# Patient Record
Sex: Female | Born: 1935 | Race: White | Hispanic: No | Smoking: Never smoker
Health system: Southern US, Community
[De-identification: ages and names within clinical notes are randomized; demographics above are authoritative.]

## PROBLEM LIST (undated history)

## (undated) DIAGNOSIS — Z8719 Personal history of other diseases of the digestive system: Secondary | ICD-10-CM

## (undated) DIAGNOSIS — D649 Anemia, unspecified: Secondary | ICD-10-CM

## (undated) DIAGNOSIS — I10 Essential (primary) hypertension: Secondary | ICD-10-CM

## (undated) DIAGNOSIS — Z9889 Other specified postprocedural states: Secondary | ICD-10-CM

## (undated) DIAGNOSIS — M1990 Unspecified osteoarthritis, unspecified site: Secondary | ICD-10-CM

## (undated) HISTORY — DX: Unspecified osteoarthritis, unspecified site: M19.90

## (undated) HISTORY — DX: Essential (primary) hypertension: I10

## (undated) HISTORY — PX: APPENDECTOMY: SHX54

## (undated) HISTORY — DX: Anemia, unspecified: D64.9

## (undated) HISTORY — PX: HERNIA REPAIR: SHX51

## (undated) HISTORY — PX: TOTAL KNEE ARTHROPLASTY: SHX125

## (undated) HISTORY — PX: UMBILICAL HERNIA REPAIR: SHX196

## (undated) HISTORY — DX: Other specified postprocedural states: Z98.890

## (undated) HISTORY — DX: Personal history of other diseases of the digestive system: Z87.19

---

## 2001-11-03 ENCOUNTER — Encounter (HOSPITAL_COMMUNITY): Admission: RE | Admit: 2001-11-03 | Discharge: 2002-02-01 | Payer: Self-pay

## 2001-11-05 ENCOUNTER — Encounter (INDEPENDENT_AMBULATORY_CARE_PROVIDER_SITE_OTHER): Payer: Self-pay | Admitting: Specialist

## 2001-11-05 ENCOUNTER — Ambulatory Visit (HOSPITAL_COMMUNITY): Admission: RE | Admit: 2001-11-05 | Discharge: 2001-11-05 | Payer: Self-pay | Admitting: Gastroenterology

## 2003-01-11 ENCOUNTER — Ambulatory Visit (HOSPITAL_COMMUNITY): Admission: RE | Admit: 2003-01-11 | Discharge: 2003-01-11 | Payer: Self-pay | Admitting: Orthopedic Surgery

## 2004-03-15 ENCOUNTER — Inpatient Hospital Stay (HOSPITAL_COMMUNITY): Admission: RE | Admit: 2004-03-15 | Discharge: 2004-03-21 | Payer: Self-pay | Admitting: Orthopedic Surgery

## 2004-03-27 ENCOUNTER — Inpatient Hospital Stay (HOSPITAL_COMMUNITY): Admission: EM | Admit: 2004-03-27 | Discharge: 2004-04-04 | Payer: Self-pay

## 2005-02-26 ENCOUNTER — Ambulatory Visit (HOSPITAL_COMMUNITY)
Admission: RE | Admit: 2005-02-26 | Discharge: 2005-02-26 | Payer: Self-pay | Admitting: Physical Medicine and Rehabilitation

## 2005-04-24 ENCOUNTER — Encounter: Admission: RE | Admit: 2005-04-24 | Discharge: 2005-07-23 | Payer: Self-pay | Admitting: Family Medicine

## 2006-04-04 ENCOUNTER — Inpatient Hospital Stay (HOSPITAL_COMMUNITY): Admission: EM | Admit: 2006-04-04 | Discharge: 2006-04-09 | Payer: Self-pay | Admitting: Emergency Medicine

## 2006-04-04 ENCOUNTER — Encounter (INDEPENDENT_AMBULATORY_CARE_PROVIDER_SITE_OTHER): Payer: Self-pay | Admitting: *Deleted

## 2006-04-06 ENCOUNTER — Encounter (INDEPENDENT_AMBULATORY_CARE_PROVIDER_SITE_OTHER): Payer: Self-pay | Admitting: Cardiology

## 2006-12-04 ENCOUNTER — Inpatient Hospital Stay (HOSPITAL_COMMUNITY): Admission: EM | Admit: 2006-12-04 | Discharge: 2006-12-08 | Payer: Self-pay | Admitting: Emergency Medicine

## 2007-02-04 ENCOUNTER — Encounter: Admission: RE | Admit: 2007-02-04 | Discharge: 2007-02-04 | Payer: Self-pay | Admitting: Orthopedic Surgery

## 2007-05-19 ENCOUNTER — Encounter (INDEPENDENT_AMBULATORY_CARE_PROVIDER_SITE_OTHER): Payer: Self-pay | Admitting: Orthopedic Surgery

## 2007-05-20 ENCOUNTER — Ambulatory Visit (HOSPITAL_BASED_OUTPATIENT_CLINIC_OR_DEPARTMENT_OTHER): Admission: RE | Admit: 2007-05-20 | Discharge: 2007-05-20 | Payer: Self-pay | Admitting: Orthopedic Surgery

## 2007-06-22 ENCOUNTER — Inpatient Hospital Stay (HOSPITAL_COMMUNITY): Admission: RE | Admit: 2007-06-22 | Discharge: 2007-06-26 | Payer: Self-pay | Admitting: Orthopedic Surgery

## 2007-07-20 ENCOUNTER — Encounter (INDEPENDENT_AMBULATORY_CARE_PROVIDER_SITE_OTHER): Payer: Self-pay | Admitting: Orthopedic Surgery

## 2007-07-20 ENCOUNTER — Ambulatory Visit: Payer: Self-pay | Admitting: Surgery

## 2007-07-20 ENCOUNTER — Ambulatory Visit (HOSPITAL_COMMUNITY): Admission: RE | Admit: 2007-07-20 | Discharge: 2007-07-20 | Payer: Self-pay | Admitting: Orthopedic Surgery

## 2007-08-23 ENCOUNTER — Inpatient Hospital Stay (HOSPITAL_COMMUNITY): Admission: EM | Admit: 2007-08-23 | Discharge: 2007-08-26 | Payer: Self-pay | Admitting: Orthopedic Surgery

## 2007-12-24 ENCOUNTER — Encounter (INDEPENDENT_AMBULATORY_CARE_PROVIDER_SITE_OTHER): Payer: Self-pay | Admitting: Surgery

## 2007-12-24 ENCOUNTER — Ambulatory Visit (HOSPITAL_COMMUNITY): Admission: RE | Admit: 2007-12-24 | Discharge: 2007-12-25 | Payer: Self-pay | Admitting: Surgery

## 2008-07-20 ENCOUNTER — Emergency Department (HOSPITAL_BASED_OUTPATIENT_CLINIC_OR_DEPARTMENT_OTHER): Admission: EM | Admit: 2008-07-20 | Discharge: 2008-07-20 | Payer: Self-pay | Admitting: Emergency Medicine

## 2010-03-20 ENCOUNTER — Ambulatory Visit (HOSPITAL_COMMUNITY): Admission: RE | Admit: 2010-03-20 | Discharge: 2010-03-21 | Payer: Self-pay | Admitting: Surgery

## 2010-07-11 ENCOUNTER — Ambulatory Visit: Payer: Self-pay | Admitting: Surgery

## 2010-07-11 ENCOUNTER — Ambulatory Visit (HOSPITAL_COMMUNITY): Admission: RE | Admit: 2010-07-11 | Discharge: 2010-07-11 | Payer: Self-pay | Admitting: Orthopedic Surgery

## 2010-09-10 ENCOUNTER — Inpatient Hospital Stay (HOSPITAL_COMMUNITY)
Admission: RE | Admit: 2010-09-10 | Discharge: 2010-09-12 | Payer: Self-pay | Source: Home / Self Care | Attending: Orthopedic Surgery | Admitting: Orthopedic Surgery

## 2010-09-10 ENCOUNTER — Encounter (INDEPENDENT_AMBULATORY_CARE_PROVIDER_SITE_OTHER): Payer: Self-pay | Admitting: Orthopedic Surgery

## 2010-12-02 LAB — APTT: aPTT: 34 seconds (ref 24–37)

## 2010-12-02 LAB — BASIC METABOLIC PANEL
BUN: 8 mg/dL (ref 6–23)
BUN: 20 mg/dL (ref 6–23)
CO2: 28 mEq/L (ref 19–32)
Calcium: 8.2 mg/dL — ABNORMAL LOW (ref 8.4–10.5)
Calcium: 9 mg/dL (ref 8.4–10.5)
Chloride: 101 mEq/L (ref 96–112)
Chloride: 100 mEq/L (ref 96–112)
Creatinine, Ser: 0.94 mg/dL (ref 0.4–1.2)
GFR calc Af Amer: 43 mL/min — ABNORMAL LOW (ref >60)
GFR calc Af Amer: >60 mL/min (ref >60)
GFR calc Af Amer: >60 mL/min (ref >60)
GFR calc non Af Amer: 35 mL/min — ABNORMAL LOW (ref >60)
GFR calc non Af Amer: 57 mL/min — ABNORMAL LOW (ref >60)
GFR calc non Af Amer: 57 mL/min — ABNORMAL LOW (ref >60)
Glucose, Bld: 124 mg/dL — ABNORMAL HIGH (ref 70–99)
Glucose, Bld: 157 mg/dL — ABNORMAL HIGH (ref 70–99)
Potassium: 3.9 mEq/L (ref 3.5–5.1)
Potassium: 3.9 mEq/L (ref 3.5–5.1)
Potassium: 3.9 mEq/L (ref 3.5–5.1)
Sodium: 133 mEq/L — ABNORMAL LOW (ref 135–145)
Sodium: 138 mEq/L (ref 135–145)
Sodium: 140 mEq/L (ref 135–145)

## 2010-12-02 LAB — URINALYSIS, ROUTINE W REFLEX MICROSCOPIC
Bilirubin Urine: NEGATIVE
Glucose, UA: NEGATIVE mg/dL
Hgb urine dipstick: NEGATIVE
Ketones, ur: NEGATIVE mg/dL
Nitrite: NEGATIVE
Protein, ur: NEGATIVE mg/dL
Specific Gravity, Urine: 1.009 (ref 1.005–1.030)
Urobilinogen, UA: 0.2 mg/dL (ref 0.0–1.0)
pH: 6.5 (ref 5.0–8.0)

## 2010-12-02 LAB — GRAM STAIN

## 2010-12-02 LAB — CBC
HCT: 27.2 % — ABNORMAL LOW (ref 36.0–46.0)
HCT: 39.7 % (ref 36.0–46.0)
Hemoglobin: 11 g/dL — ABNORMAL LOW (ref 12.0–15.0)
Hemoglobin: 9.1 g/dL — ABNORMAL LOW (ref 12.0–15.0)
MCH: 31.7 pg (ref 26.0–34.0)
MCH: 31.9 pg (ref 26.0–34.0)
MCHC: 33.5 g/dL (ref 30.0–36.0)
MCV: 94.8 fL (ref 78.0–100.0)
MCV: 94.7 fL (ref 78.0–100.0)
Platelets: 169 10*3/uL (ref 150–400)
Platelets: 216 10*3/uL (ref 150–400)
Platelets: 280 10*3/uL (ref 150–400)
RBC: 2.87 MIL/uL — ABNORMAL LOW (ref 3.87–5.11)
RBC: 3.45 MIL/uL — ABNORMAL LOW (ref 3.87–5.11)
RBC: 4.19 MIL/uL (ref 3.87–5.11)
RDW: 12.9 % (ref 11.5–15.5)
RDW: 13.3 % (ref 11.5–15.5)
WBC: 10.1 10*3/uL (ref 4.0–10.5)
WBC: 7.6 10*3/uL (ref 4.0–10.5)
WBC: 5.8 10*3/uL (ref 4.0–10.5)

## 2010-12-02 LAB — SURGICAL PCR SCREEN
MRSA, PCR: NEGATIVE
Staphylococcus aureus: NEGATIVE

## 2010-12-02 LAB — GLUCOSE, CAPILLARY
Glucose-Capillary: 111 mg/dL — ABNORMAL HIGH (ref 70–99)
Glucose-Capillary: 119 mg/dL — ABNORMAL HIGH (ref 70–99)
Glucose-Capillary: 130 mg/dL — ABNORMAL HIGH (ref 70–99)

## 2010-12-02 LAB — ANAEROBIC CULTURE: Gram Stain: NONE SEEN

## 2010-12-02 LAB — DIFFERENTIAL
Basophils Absolute: 0 10*3/uL (ref 0.0–0.1)
Lymphocytes Relative: 21 % (ref 12–46)
Lymphs Abs: 1.2 10*3/uL (ref 0.7–4.0)
Monocytes Absolute: 0.6 10*3/uL (ref 0.1–1.0)
Neutro Abs: 3.8 10*3/uL (ref 1.7–7.7)

## 2010-12-02 LAB — WOUND CULTURE
Culture: NO GROWTH
Gram Stain: NONE SEEN

## 2010-12-02 LAB — PROTIME-INR: Prothrombin Time: 14 seconds (ref 11.6–15.2)

## 2010-12-02 LAB — URINE MICROSCOPIC-ADD ON

## 2010-12-08 LAB — CBC
HCT: 35 % — ABNORMAL LOW (ref 36.0–46.0)
MCHC: 32.5 g/dL (ref 30.0–36.0)
MCV: 96 fL (ref 78.0–100.0)
Platelets: 273 10*3/uL (ref 150–400)
RDW: 14.7 % (ref 11.5–15.5)

## 2010-12-08 LAB — COMPREHENSIVE METABOLIC PANEL
Albumin: 3.7 g/dL (ref 3.5–5.2)
BUN: 27 mg/dL — ABNORMAL HIGH (ref 6–23)
Calcium: 9.9 mg/dL (ref 8.4–10.5)
Creatinine, Ser: 0.97 mg/dL (ref 0.4–1.2)
Total Bilirubin: 0.6 mg/dL (ref 0.3–1.2)
Total Protein: 6.2 g/dL (ref 6.0–8.3)

## 2010-12-08 LAB — GLUCOSE, CAPILLARY
Glucose-Capillary: 72 mg/dL (ref 70–99)
Glucose-Capillary: 85 mg/dL (ref 70–99)

## 2010-12-08 LAB — SURGICAL PCR SCREEN: MRSA, PCR: NEGATIVE

## 2011-02-04 NOTE — H&P (Signed)
[name], [name]                 ACCOUNT NO.:  [id number]   MEDICAL RECORD NO.:  [id number]         PATIENT TYPE:  LINP   LOCATION:                               FACILITY:  [facility]   PHYSICIAN:  Madlyn Frankel. Charlann Boxer, M.D.  DATE OF BIRTH:  1922-11-30   DATE OF ADMISSION:  06/22/2007  DATE OF DISCHARGE:                              HISTORY & PHYSICAL   PROCEDURE:  A right total knee arthroplasty.   CHIEF COMPLAINT:  Right knee pain.   HISTORY OF PRESENT ILLNESS:  This is an 77 year old female with a  history of right knee pain with severe valgus deformity secondary to  lateral bone-on-bone osteoarthritis.  It has been refractory to all  conservative treatment.  She also has a history of a left total knee  replacement in 2006 with which she has had some persistent pain issues.  Her primary care physician is Dr. Gildardo Cranker who will be providing the  presurgical assessment.   PAST MEDICAL HISTORY SIGNIFICANT FOR:  1. Osteoarthritis.  2. Depression.  3. Hypertension.  4. Acid reflux disease.  5. Incontinence.  6. Anxiety.   PAST SURGICAL HISTORY:  Left knee replacement in 2006 and appendectomy.   FAMILY HISTORY:  Noncontributory.   SOCIAL HISTORY:  Married, retired.  Primary caregiver after surgery will  be [employer] and daughter.   DRUG ALLERGIES:  No known drug allergies.   MEDICATIONS:  1. Pantoprazole 40 mg p.o. daily.  2. Lisinopril 40 mg p.o. daily.  3. Cymbalta 90 mg p.o. daily.  4. Proprion SR 150 mg p.o. b.i.d. (please verify with the patient at      admission).  5. Alprazolam 0.5 mg one half tablet p.o. 4x a day.   REVIEW OF SYSTEMS:  NEUROLOGY:  Anxiety, depression.  Has some blurred  vision.  GASTROINTESTINAL:  Hiatal hernia.  HEMATOLOGY/ONCOLOGY:  Had  some anemia during her last surgery.  Otherwise see HPI.   PHYSICAL EXAM:  VITAL SIGNS:  Pulse 76, respirations 18, blood pressure  124/64.  GENERAL:  Awake, alert and oriented, well-developed,  well-nourished, no  acute distress.  NECK:  Supple, no carotid bruits.  CHEST/LUNGS:  Clear to auscultation bilaterally.  HEART:  Regular rate and rhythm without gallops, clicks, rubs, or  murmurs.  ABDOMEN:  Soft, nontender, nondistended.  Bowel sounds present.  GENITOURINARY:  Deferred.  EXTREMITIES:  Right knee has severe valgus deformity with significant  hypertrophic synovium.  She does come out to full extension with flexion  back to 120.  SKIN:  Intact.  No cellulitis.  NEUROLOGIC:  Intact distal sensibilities.   LABS EKG CHEST X-RAY:  All pending.   IMPRESSION:  1. Right knee osteoarthritis.  2. Anxiety/depression.  3. Hypertension.  4. Acid reflux disease.  5. Incontinence.   PLAN OF ACTION:  Right total knee arthroplasty.  Risks, complications  were discussed.  Questions were encouraged, answered, and reviewed.   Postoperative medications including Lovenox Robaxin, iron, aspirin,  Colace, MiraLax were provided at the time of history and physical.  Postoperative pain medication will be provided at the time of surgery.  ______________________________  Yetta Glassman [address], [place]      Madlyn Frankel. Charlann Boxer, M.D.  Electronically Signed    BLM/MEDQ  D:  06/17/2007  T:  06/18/2007  Job:  [id number]   cc:   C. Duane Lope, M.D.  Fax: [phone]

## 2011-02-04 NOTE — Op Note (Signed)
NAMEGRACELYN, COVENTRY                 ACCOUNT NO.:  [id number]   MEDICAL RECORD NO.:  [id number]          PATIENT TYPE:  INP   LOCATION:  1603                         FACILITY:  [facility]   PHYSICIAN:  Madlyn Frankel. Charlann Boxer, M.D.  DATE OF BIRTH:  [birth year]   DATE OF PROCEDURE:  06/22/2007  DATE OF DISCHARGE:                               OPERATIVE REPORT   PREOPERATIVE DIAGNOSIS:  End stage right knee osteoarthritis.   POSTOPERATIVE DIAGNOSIS:  End stage right knee osteoarthritis.   PROCEDURE:  Right total knee replacement.   COMPONENTS USED:  DePuy rotating platform posterior stabilized knee  system, size 2 femur, 2 tibia, and 10 mm insert with a 35 patella  button.   SURGEON:  Madlyn Frankel. Charlann Boxer, M.D.   ASSISTANT:  Yetta Glassman. Mann, P.A.-C.   ANESTHESIA:  Duramorph spinal plus MAC anesthesia.   COMPLICATIONS:  None.   DRAINS:  One.   TOURNIQUET TIME:  48 minutes at 250 mmHg.   INDICATIONS FOR PROCEDURE:  Ms. Bai is a 77 year old female who I have  been following for some time with advanced right knee osteoarthritis and  severe valgus deformity.  She had multiple and recurrent swelling and  synovial bogginess making it hard to aspirate and inject her knees.  She  failed to respond to conservative measures.  Given the persistent and  recurring symptoms, she wished to proceed with the surgical procedure,  though she was a little reluctant to begin with due to the recovery that  she had from her left knee.  The risks and benefits were discussed  including infection, DVT, component failure, need for revision as well  as postoperative pain and discomfort, and consent obtained.   PROCEDURE IN DETAIL:  The patient was brought to the operative theater.  Once adequate anesthesia and preoperative antibiotics, Ancef, were  administered, the patient was positioned supine.  A proximal thigh  tourniquet was placed. The right lower extremity was pre-scrubbed and  prepped and draped in a sterile  fashion.  Given the severe valgus nature  of her knee, I tried to passively correct it to normal before making my  midline incision.  This was done.  A median parapatellar arthrotomy was  carried out.  The patient had a very large effusion with a significantly  large boggy synovium.  Initial exposure consisted of a large synovectomy  superior, anterior medial, and anterior lateral aspects of the knee.  Following this, I attended to the patella.  The patella's precut  measurement was 23 mm.  I resected down to 14 mm and used a 35 patellar  button.  A metal shim was placed to protect the patella from retractor  placement.   At this point, attention was directed to the femur.  The femoral canal  was opened and irrigated to prevent fat emboli.  The intramedullary rod  was then passed into the femoral shaft and at 5 degrees valgus we  resected 10 mm bone.  I was able to resect a little bit of bone off the  lateral condyle despite the severe valgus nature of her knee.  Despite  the valgus nature of the knee, I was able to resect a couple of mm off  the lateral femur.  It did not appear to be significantly hypoplastic.  Following the distal cut, I checked the size of the femur and determined  it was going to be a size 2.  The posterior condylar axis was a little  internally rotated compared to [facility] axis, so I ended up shifting  it down a little bit on the posterolateral aspect of the knee.  This  preserved the joint space in flexion medially.   The anterior, posterior, and chamfer cuts were all made without  complication or notching.  The final box cut was then made without  difficulty.  This was based off the lateral aspect of the distal femur.  At this point, attention was directed to the tibia. Further tibial  exposure including meniscectomies medially and laterally were carried  out.  This did involve, also, synovial debridement both medially and  laterally.  Following exposure of  the tibia, I used an extramedullary  device and resected approximately 6 mm of bone off medial side and 2 mm  laterally.  This was done based on the fact that she was pretty lack to  begin with this valgus knee.  Following this cut, I checked the extensor  block and was happy the knee came out to full extension.  I checked the  cut surface and determined a size 2 would fit best at the cut surface.  I also checked with the alignment rod and felt that my initial cut was a  little bit of varus, so I ended up resecting more of the bone off the  lateral side of the tibia.   I did repeat checks with my alignment rod on the tibial tray and then  was happy and satisfied that the cut was perpendicular in the AP and  lateral planes.  At this point, the final tibial tray position was set,  drilled, and keel punched.  Trial reduction was carried out with 2  femur, 2 tibia and, and a 10 mm insert. The knee came out to full  extension.  There was a little bit of opening medially stretching valgus  knee.  It was not excessive.  The patella tracked without application of  pressure with a 35 button in place.  The trial components were then  removed, the knee was irrigated with normal saline solution and pulse  lavaged and injected with 60 mL of 0.25% Marcaine with epinephrine and 1  mL of Toradol.  The final components were opened and the cement mixed.  The final components were cemented into position and the knee was  brought out to extension with the 10 mm spacer in place.  Extruded  cement was removed.  Once the cement had cured and I was satisfied there  was no remaining cement throughout the knee, the final insert was  placed.  I did let down the tourniquet prior to placing the insert did  inject 5 mL of FloSeal in the posterior, medial, and lateral aspects of  the knee.   At this point, the medium Hemovac drain was placed deep.  Final  polyethylene placed as noted and the knee brought to flexion.  A #1  Vicryl was used to reapproximate the extensor mechanism, 2-0 Vicryl was  used in the subcu layer.  It should be noted that the subcu layer and  epidural layer were very thin so I chose  to use a regular stapler as  opposed to a subcu stitch.  At this point the knee was cleaned, dried  and dressed sterilely with a bulky sterile dressing.  She was brought to  the recovery room in stable condition.      Madlyn Frankel Charlann Boxer, M.D.  Electronically Signed     MDO/MEDQ  D:  06/22/2007  T:  06/22/2007  Job:  [id number]

## 2011-02-04 NOTE — Op Note (Signed)
[name], PATIENT                 ACCOUNT NO.:  [id number]   MEDICAL RECORD NO.:  [id number]          PATIENT TYPE:  OIB   LOCATION:  1505                         [facility]AC[facility]  [facility]   PHYSICIAN:  Thornton Park. Daphine Deutscher, MD  DATE OF BIRTH:  February 27, 1923   DATE OF PROCEDURE:  12/24/2007  DATE OF DISCHARGE:                               OPERATIVE REPORT   PREOPERATIVE DIAGNOSIS:  Umbilical hernia, which is more of a ventral  hernia from a previous laparoscopy.   HISTORY:  This is an 77 year old white female who had a ruptured  appendix in July 2007, repaired laparoscopically; this was repaired with  Prolene at that time and she developed a recurrence of this umbilical  hernia, which had actually gotten kind of big.   The patient was taken to room #1 on December 24, 2007 and given general  anesthesia.  The abdomen was prepped with Techni-Care and draped  sterilely.  I entered through a left upper quadrant 5-mm 0-degree port  site with the Optiview technique without difficulty, insufflated and  identified the defect at the umbilicus.  I removed some incarcerated  omentum.  I then elected to cut down on this from the outside to remove  the very large sac and I did; this left me with about a 2-cm defect.  I  tried a large Ventralex mesh, but it would not deploy safely and exposed  some of the polypropylene mesh to the bowel; I therefore removed it.  I  repaired this transversely with a closure using #1 Novofils,  transversely incorporating the Ultrapro mesh into the transverse  closure; these were tied.  The area was infiltrated.  The wound was then  tacked and closed 4-0 Vicryl and with Dermabond.  Port sites were also  closed with 4-0 Vicryl and Dermabond.  Abdominal binder was applied and  the patient will be kept for overnight observation.   IMPRESSION:  Status post repair of incarcerated umbilical hernia.      Thornton Park Daphine Deutscher, MD  Electronically Signed     MBM/MEDQ  D:  12/24/2007  T:   12/24/2007  Job:  [id number]   cc:   C. Duane Lope, M.D.  Fax: [phone]

## 2011-02-04 NOTE — Op Note (Signed)
[name], [name]                 ACCOUNT NO.:  [id number]   MEDICAL RECORD NO.:  [id number]          PATIENT TYPE:  AMB   LOCATION:  DSC                          FACILITY:  [facility]   PHYSICIAN:  Cindee Salt, M.D.       DATE OF BIRTH:  [birth year]   DATE OF PROCEDURE:  05/20/2007  DATE OF DISCHARGE:                               OPERATIVE REPORT   PREOPERATIVE DIAGNOSES:  1. Carpal tunnel syndrome, left hand.  2. Stenosing tenosynovitis, left middle finger.   POSTOPERATIVE DIAGNOSES:  1. Carpal tunnel syndrome, left hand.  2. Stenosing tenosynovitis, left middle finger.  3. Tenosynovitis.   OPERATION:  Release left carpal canal, release A1 pulley left middle  finger with tenosynovectomy left middle finger.   SURGEON:  Cindee Salt, M.D.   ASSISTANTCarolyne Fiscal, RN.   ANESTHESIA:  Forearm based intravenous regional.   HISTORY:  The patient is an 77 year old female with a history of carpal  tunnel syndrome, triggering of the left middle finger, both  nonresponsive to conservative treatment.  EMG nerve conductions are  positive.  She has elected to proceed to have these released under  regional anesthesia.  She is aware of risks and complications including  infection, recurrence of injury to arteries, nerves, tendon, incomplete  relief of symptoms, dystrophy.  In preoperative area, questions are  encouraged and answered.  The extremity marked by both the patient and  surgeon.   PROCEDURE:  The patient is brought to the operating room where a forearm  based IV regional anesthetic was carried out without difficulty.  She  was prepped using DuraPrep, supine position, left arm free.  A  longitudinal incision was made in the palm, carried down through  subcutaneous tissue.  Bleeders were Bovie electrocauterized.  The palmar  fascia was split.  Superficial palmar arch identified.  The flexor  tendon of the ring and little finger identified.  To the ulnar side of  the  median nerve, the  carpal retinaculum was incised with sharp  dissection.  A right angle and Sewell retractor were placed between the  skin and forearm fascia.  The fascia was released for approximately 1.5  cm proximal to this space under direct vision.  The canal was explored.  The area of compression to the nerve was immediately apparent.  Tenosynovial tissue was moderately thickened, no further lesions were  identified.  The wound was irrigated and the skin closed with  interrupted 5-0 Vicryl Rapide  sutures.  An oblique incision was then  made over the A1 pulley of the left middle finger, carried down to  subcutaneous tissue, bleeders electrocauterized, neurovascular  structures identified and protected, retractor placed.  The significant  thickening in the A1 pulley was noted.  This was released in its radial  aspect.  A small incision was made distally.  A triggering was still  present.  A tenosynovectomy was then performed and a very significant  tenosynovitis was present with a large amount of fluid.  The  tenosynovial tissue was then sent to pathology.  No further triggering  was noted.  The wound was irrigated.  Skin closed with interrupted 5-0  Vicryl Rapide sutures.  Sterile compressive dressing with a  splint with the fingers free applied.  The patient tolerated the  procedure well and was taken to the recovery room for observation in  satisfactory condition.   She will be discharged home  to return to the [facility]  in [place]  in one week on  Vicodin.           ______________________________  Cindee Salt, M.D.     GK/MEDQ  D:  05/20/2007  T:  05/20/2007  Job:  [id number]   cc:   C. Duane Lope, M.D.

## 2011-02-04 NOTE — H&P (Signed)
[name], [name]                 ACCOUNT NO.:  [id number]   MEDICAL RECORD NO.:  [id number]          PATIENT TYPE:  INP   LOCATION:  1606                         FACILITY:  [facility]   PHYSICIAN:  Madlyn Frankel. Charlann Boxer, M.D.  DATE OF BIRTH:  21-Aug-1923   DATE OF ADMISSION:  08/23/2007  DATE OF DISCHARGE:                              HISTORY & PHYSICAL   CHIEF COMPLAINT:  Right knee and leg swelling.   HISTORY OF PRESENT ILLNESS:  This is an 77 year old female with a  history of right total knee replacement approximately nine weeks ago.  She has had persistent swelling with Dopplers that have ruled out DVT.  She had been refractory to all conservative treatment including physical  therapy, early ambulation and use of Lasix.  She was seen in our office  today with persistent swelling.  We aspirated fluid from her knee which  will be sent off for testing to rule out infection.  Due to the  persistent nature of the swelling and with some mild looking cellulitic  changes of her right foot, she is to be admitted for IV antibiotic  treatment and potential washout if positive for infection.   PAST MEDICAL HISTORY:  1. Osteoarthritis with right total knee replacement and left total      knee replacement.  2. Depression.  3. Hypertension.  4. GERD.  5. Incontinence.  6. Anxiety.   PAST SURGICAL HISTORY:  1. Left total knee replacement in 2006.  2. Right total knee replacement in 2008.   FAMILY HISTORY:  Noncontributory.   SOCIAL HISTORY:  Married.   ALLERGIES:  No known drug allergies.   MEDICATIONS:  1. Pantoprazole 40 mg p.o. daily.  2. Lisinopril 40 mg p.o. daily.  3. Cymbalta 90 mg p.o. daily.  4. Wellbutrin 150 mg p.o. b.i.d.  5. Alprazolam 0.5 mg 1/2 tablet p.o. q.i.d. p.r.n. anxiety.   REVIEW OF SYSTEMS:  NEUROLOGICAL:  She has some anxiety and some blurred  vision.  GI: Hiatal hernia.  HEMATOLOGY:  She has a history of anemia  with past surgeries.  Otherwise, see HPI.   PHYSICAL EXAMINATION:  VITAL SIGNS:  Temperature 98, pulse 88,  respirations 18, blood pressure 158/82.  GENERAL:  She is awake, alert, oriented, well-developed, well-nourished  in a wheelchair.  NECK:  Supple.  No carotid bruits.  CHEST:  Lungs clear to auscultation bilaterally.  BREASTS:  Deferred.  HEART:  Regular rate and rhythm without murmurs.  ABDOMEN:  Soft, nontender, nondistended.  Bowel sounds present.  GENITOURINARY:  Deferred.  EXTREMITIES:  Right lower extremity is significantly swollen versus the  left.  I am unable to palpate a dorsalis pedis pulse of the right lower  extremity.  She does have brisk capillary refill.  She does have some  redness of the right lower extremity especially around her foot.  Her  dorsalis pedis pulse is positive in her left lower extremity.  SKIN:  Right lower extremity is swollen with redness cellulitic in  nature of the right foot area.  NEUROLOGIC:  Intact distal sensibilities.   LABS:  Upon admission  include CBC with diff, BMP, coags including INR,  UA, ESR, CRP and chest x-ray.   IMPRESSIONS:  1. Right knee swelling was high suspicion of cellulitis.  Plan of      action will be IV antibiotic treatment.  2. Possible washout with high likelihood of infection.  Risks and      complications were discussed with the patient.     ______________________________  Yetta Glassman. [address], [place]      Madlyn Frankel. Charlann Boxer, M.D.  Electronically Signed    BLM/MEDQ  D:  08/23/2007  T:  08/24/2007  Job:  [id number]

## 2011-02-04 NOTE — Discharge Summary (Signed)
NAMEJANASHA, [name]                 ACCOUNT NO.:  [id number]   MEDICAL RECORD NO.:  [id number]          PATIENT TYPE:  INP   LOCATION:  1603                         FACILITY:  [facility]   PHYSICIAN:  [name], M.D.  DATE OF BIRTH:  [birth year]   DATE OF ADMISSION:  06/22/2007  DATE OF DISCHARGE:  06/26/2007                               DISCHARGE SUMMARY   ADMITTING DIAGNOSES:  1. Osteoarthritis.  2. Depression.  3. Hypertension.  4. Acid reflux disease.  5. Incontinence.  6. Anxiety.   DISCHARGE DIAGNOSES:  1. Osteoarthritis.  2. Depression.  3. Hypertension.  4. Acid reflux disease.  5. Incontinence.  6. Anxiety.  7. Acute blood loss anemia.   CONSULTANTS:  None.   PROCEDURE:  Right total knee replacement.   SURGEON:  [name].   ASSISTANT:  [name].   HISTORY OF PRESENT ILLNESS:  An 77 year old female, history right knee  pain with severe valgus deformity, secondary to lateral bone-on-bone  osteoarthritis.  Refractory to all conservative treatment.  History of  left total knee replacement 2006.  Primary care physician, Dr. Gildardo Cisneros, who provided pre-surgical assessment.   LABS:  Pre-admission CBC:  Hematocrit 35.1, did dip down to 17.7, was  transfused 2 units of packed red blood cells.  Hematocrit came up to  25.4 at time of discharge.  Occult blood was positive on the third.  Coagulation INR normal 1.  Routine chemistry pre-admission all normal.  Did bump [place] a little bit in her glucose 140, all others stable and  normal.  Kidney function normal perfusion.  Calcium 8.4 at discharge.  Routine chemistry GI workup:  Her total protein was 5.9, alk phos was  137, UA negative.   CARDIOLOGY:  EKG:  Sinus tachycardia, incomplete right bundle branch  block, left anterior fascicular block, abnormal ECG.   RADIOLOGY:  No chest x-ray found in chart.   HOSPITAL COURSE:  The patient underwent right total knee replacement,  admitted to the orthopedic floor.   Pain was well-controlled throughout  the course of stay.  Dressing was changed on a daily basis after post-op  day #1.  No significant drainage from the wound at any time.  She was  neurovascular intact of her right lower extremity, with a positive  straight leg raise on post-op day #1 with effort.  Her quads were sore,  which is appropriate for surgery.  She was weightbearing as tolerated,  progressed nicely during her course of stay.  She did have some acute  blood loss anemia with transfusion taking place on the 3rd.  Hematocrit  came back up as noted in labs.  She was afebrile and was ready to go  home.   DISCHARGE DISPOSITION:  Discharged home in stable and improved condition  with home health care PT.   DISCHARGE DIET:  Regular or diabetic.   DISCHARGE WOUND CARE:  Keep dry.   DISCHARGE PHYSICAL THERAPY:  She is weightbearing as tolerated, with the  use of rolling walker and then transition to single point cane.  Goals  of physical therapy will  be range of motion zero to 100 at 2 weeks, zero  to 120, 6 weeks.   DISCHARGE MEDICATIONS:  Include:  1. Lovenox 30 mg subcu q. 12 x11 days.  2. Percocet 5/325, one to two p.o. q.4-6 p.r.n. pain.  3. Robaxin 500 mg p.o. q.6 muscle spasm.  4. Pantoprazole 40 mg p.o. q.a.m.  5. Lisinopril 40 mg p.o. q.a.m.  6. Cymbalta 90 mg at noon.  7. Wellbutrin 150 mg p.o. b.i.d.  8. Alprazolam 0.5 mg 1/2 tablet four times daily.  9. Milk of Magnesia q.h.s.  10.Vitamin B complex daily.  11.Iron 325 mg p.o. t.i.d. x3 weeks.  12.Lasix 20 mg p.o. daily.  13.Potassium 20 mEq p.o. daily.   DISCHARGE FOLLOWUP:  Follow up with Dr. Charlann Cisneros, [phone], in 2 weeks for  wound check.     ______________________________  [name], [place]      [name], M.D.  Electronically Signed    BLM/MEDQ  D:  07/12/2007  T:  07/12/2007  Job:  [id number]   cc:   C. Duane Lope, M.D.  Fax: [phone]

## 2011-02-07 NOTE — Procedures (Signed)
[facility]  Patient:    [name], [name] Visit Number: [id number] MRN: [id number]          Service Type: END Location: ENDO Attending Physician:  Louie Bun Dictated by:   Everardo All Madilyn Fireman, M.D. Proc. Date: 11/05/01 Admit Date:  11/05/2001   CC:         Jamesetta Geralds, M.D.   Procedure Report  PROCEDURE:  Esophagogastroduodenoscopy with biopsy.  INDICATION FOR PROCEDURE:  Severe anemia with hemoglobin 7.1, requiring recent transfusion.  DESCRIPTION OF PROCEDURE:  The patient was placed in the left lateral decubitus position and placed on the pulse monitor with continuous low-flow oxygen delivered by nasal cannula.  She was sedated with 50 mg IV Demerol and 6 mg IV Versed.  The Olympus video endoscope was advanced under direct vision into the oropharynx and esophagus.  The esophagus was slightly tortuous but of normal caliber with the squamocolumnar line at 31 cm.  There was no obvious ring or stricture, visible esophagitis or evidence or Barretts esophagus. There was a very broad hiatal hernia approximately 8 cm in length distal to the squamocolumnar line.  The stomach was entered, and a small amount of liquid secretions were suctioned from the fundus.  Retroflexed view of the cardia confirmed a broad hiatal defect and otherwise was unremarkable with no ulcerations noted in the hernia sac.  The intrathoracic and intra-abdominal fundus and body appeared normal with no ulcers noted.  In the antrum, there was seen several streaks of erythema and granularity radiating toward the pylorus consistent with mild antral gastritis.  A CLOtest was obtained.  The duodenum was entered and both the bulb and second portion were well-inspected and appeared to be within normal limits.  The scope was advanced as far as possible down into the duodenum, and biopsies were taken to rule out celiac disease.  The scope was then withdrawn, and the patient returned to  the recovery room in stable condition.  She tolerated the procedure well, and there were no immediate complications.  IMPRESSION: 1. Large hiatal hernia. 2. Mild antral gastritis.  PLAN:  Await CLOtest and biopsy results.  Suspect anemia may be related to the large hiatal hernia.  Will also proceed with colonoscopy at this time. Dictated by:   Everardo All Madilyn Fireman, M.D. Attending Physician:  Louie Bun DD:  11/05/01 TD:  11/06/01 Job: [id number]

## 2011-02-07 NOTE — H&P (Signed)
NAME:  [name], [name] NO.:  [id number]   MEDICAL RECORD NO.:  [id number]                   PATIENT TYPE:  EMS   LOCATION:  MAJO                                 FACILITY:  [facility]   PHYSICIAN:  Hollice Espy, M.D.            DATE OF BIRTH:  08/24/1923   DATE OF ADMISSION:  03/27/2004  DATE OF DISCHARGE:                                HISTORY & PHYSICAL   PRIMARY CARE PHYSICIAN:  Miguel Aschoff, M.D.   ORTHOPAEDIC SURGEON:  Harvie Junior, M.D.   [facility], Dr. Danise Edge.   CHIEF COMPLAINT:  GI bleed.   HISTORY OF PRESENT ILLNESS:  This is a 77 year old white female with past  medical history of diabetes, hypertension, and osteoporosis, who was  discharged from [facility] status post a left total knee  replacement on March 21, 2004.  The patient at that time was started on  Coumadin for the first time for DVT prophylaxis.  She had done relatively  well, her only complaint being of leg pain when last night she started  having sudden onset vomiting of blood.  These episodes occurred x2 yesterday  and persisted today.  The patient also was noted to have some black stool  increased today.  She tells me that she has been on iron in the past and has  had previously dark stools, although I do not see any indication of iron on  her discharge summary from one week ago.  The patient had stat labs done at  the [facility] where she was getting rehab, and her hemoglobin was noted  to be previously 12.1 and 37.1 on March 23, 2004, however, today, it was down  to 6.9.  The patient was brought in to the emergency room for a stat  evaluation of a GI bleed.  Here, she had lab work done and she was noted to  have a white count of 15.5 with a normal shift.  Her H&H, however, were 6.8  and 20.  Her platelet count was elevated at 723.  In addition, the patient's  INR was elevated at 3, consistent with her being on Coumadin.  Her  BUN was  elevated at 79, with a creatinine slightly elevated at 1.2.  The patient was  started on IV fluids, was typed and crossed, and is currently receiving the  first of two units of packed red blood cells.  Her blood pressure has been  borderline hypotensive with a systolic of 103.  She is not tachycardic.  Currently, she states that she is feeling a little bit better.  She denies  any headache, visual changes, or dysphagia.  She denies any current nausea  or vomiting.  She denies any chest pain or palpitations.  She denies any  shortness of breath, wheeze, cough.  She denies any abdominal pain until I  started to palpate on her abdomen.  The patient's only real complaint is of  occasional leg pains.  She has trouble getting comfortable when she lays on  one leg she begins to hurt and so she changes position.  The patient also  denies any hematuria or dysuria.  She denies any constipation and does note  she has had some problems with dark stools.  She is not sure if these are  bloody.   PAST MEDICAL HISTORY:  1. Osteoporosis.  2. Hypertension.  3. Diabetes.  4. Hyperlipidemia.  5. Recent left total knee replacement.  6. DJD.  7. History of chronic anemia, status post EGD done in February 2003, by Dr.     Madilyn Fireman.  At that time, she was noted to have antral gastritis.  8. History of anxiety and depression.  9. History of right carpal tunnel syndrome.   MEDICATIONS:  According to her discharge summary list from one week ago:  1. Vicodin 10 mg q.6h. p.r.n. pain.  2. Coumadin q.h.s.  3. Aciphex 20 mg daily.  4. Lisinopril 20 mg daily.  5. Hydrochlorothiazide 25 mg daily.  6. Caltrate 600 mg b.i.d.  7. Pravachol 40 mg q.h.s.  8. Effexor XR 150 mg daily.  9. Xanax 1 mg t.i.d. p.r.n.  10.      Prozac 40 mg daily.  11.      Milk of magnesia q.h.s.  12.      Fosamax 70 mg every week.  13.      In addition, the patient also takes Sanctura for bladder control.   ALLERGIES:  No known  drug allergies.   SOCIAL HISTORY:  She denies any alcohol, tobacco, or drug use.   FAMILY HISTORY:  Noncontributory.   PHYSICAL EXAMINATION:  VITAL SIGNS:  Temperature 97.2, heart rate was 66 at  time of transfer, has gone up slightly to 98, respirations 18, blood  pressure 108/56, which has stayed steady.  GENERAL:  She appears to be in mild general distress secondary more to leg  pain than anything else, but she is alert and oriented x3, and looks quite  fatigued.  HEENT:  Normocephalic, atraumatic.  She has dry mucous membranes.  NECK:  No carotid bruits.  HEART:  Regular rate and rhythm.  S1 and S2.  LUNGS:  Clear to auscultation bilaterally.  ABDOMEN:  Soft, slightly tender, but no focal tenderness and more diffuse.  It is non-distended.  She has very hypoactive bowel sounds.  Her Hemoccult  is heme positive with report of black stool by the ER attending.  EXTREMITIES:  Her left knee shows status post incision with staples.  The  incision itself appears to be clean and dry without erythema.  She has some  surrounding erythema of the left patellar region.  It is slightly tender.  She has 1+ pulses at best.  She has 1+ pitting edema in her bilateral lower  extremities.   LABORATORY DATA:  The patient's sodium is 132, potassium 4.7, chloride 100,  bicarbonate 24, BUN 79, creatinine 1.2, glucose 103.  Her LFT's:  Alkaline  phosphatase is 149, AST 18, ALT 13, total protein 5.3, albumin 2.6, calcium  9.5.  Her lipase is normal at 18.  pH of 7.49, PCO2 of 35, bicarbonate of  27.  Her PTT is 54, PT/INR 24.5 and 3.  Her white count is 15.5, H&H of 6.8  and 20.  MCV is 86.  Platelet count is 723.  She has a 75% shift which is  considered a high end  of normal.   ASSESSMENT AND PLAN:  1. Upper gastrointestinal bleed.  The patient with a history of severe     anemia and she is also on Coumadin.  In addition, I note that her PTT is    elevated.  I am wondering that if she is on Vicodin for  pain that may     have interfered slightly with her liver dysfunction, although her     transaminases are normal, but either way this is a likely cause of a     bleeding ulcer following her knee replacement.  We will make her n.p.o.,     intravenous fluids, she is getting a pack of red blood cells, intravenous     Protonix, and hold her Coumadin, check her PT/INR in the morning.  GI,     Dr. Laural Benes, from [facility] will see her and possibly scope her tomorrow.  2. Hypertension.  Her blood pressure is borderline hypotensive.  It should     improve with blood.  We can restart her medications then.  3. Diabetes.  We will cover with sliding scale.  She is normally diet     controlled.  4. Osteoporosis.  Holding her calcium and Fosamax.  5. Recent left total knee replacement.  We will notify Dr. Jodi Geralds of     orthopaedics, the patient is currently here.  She is scheduled to follow     up with him in one week which would be tomorrow.  He may want to see her     in the hospital.  Also note that the patient's left knee is erythematous,     but she has no fever and no shift.  Her increase in white count may be     stress only, so we will monitor to make sure she has no sign of a     cellulitis.  6. Depression.  The patient is on both Effexor and Prozac which would     question if both are indicated, but would make no changes at this time.     We will hold both for now until she is p.o.  7. Hyperlipidemia.  Holding her Pravachol.                                                Hollice Espy, M.D.    SKK/MEDQ  D:  03/27/2004  T:  03/27/2004  Job:  [id number]   cc:   C. Duane Lope, M.D.  [address]  [place]  [place] [address]  Fax: [phone]   Hal T. Stoneking, M.D.  [address]  [place], [place] [address]  Fax: [phone]   Harvie Junior, M.D.  [address]  [place]  [place] [address]  Fax: [phone]   Danise Edge, M.D.  [address]  [place]  [place] [address]  Fax: [phone]

## 2011-02-07 NOTE — Discharge Summary (Signed)
[name], [name]                           ACCOUNT NO.:  [id number]   MEDICAL RECORD NO.:  [id number]                   PATIENT TYPE:  INP   LOCATION:  5008                                 FACILITY:  [facility]   PHYSICIAN:  Harvie Junior, M.D.                DATE OF BIRTH:  [birth year]   DATE OF ADMISSION:  03/15/2004  DATE OF DISCHARGE:  03/21/2004                                 DISCHARGE SUMMARY   ADMITTING DIAGNOSES:  1. Degenerative joint disease, left knee.  2. Hypertension.  3. Diet controlled diabetes mellitus.  4. Chronic anemia.  5. History of anxiety and depression.   DISCHARGE DIAGNOSES:  1. Degenerative joint disease, left knee.  2. Hypertension.  3. Diet controlled diabetes mellitus.  4. Chronic anemia.  5. History of anxiety and depression.  6. Chronic anemia postoperatively.  7. Right carpal tunnel syndrome.   PROCEDURES IN THE HOSPITAL:  Left total knee arthroplasty, Harvie Junior,  M.D., March 15, 2004.   CONSULTATIONS IN HOSPITAL:  [facility]   BRIEF HISTORY:  [name] is a 77 year old female who we have followed for  a long time with severe DJD of both knees.  She has significant pain at  night in her knees as well as with ambulation.  She has x-ray findings of  bone-on-bone changes in both knees, left was greater than right.  She was  temporarily improved with cortisone injections, modification of her  activities, and use of pain medications, but she continued to have problems  with her knees and based upon her radiographic and clinical findings was  felt to be a candidate for a left total knee arthroplasty.  She is admitted  for this.   PERTINENT LABORATORY STUDIES:  The patient's hemoglobin, on admission, was  10.4, hematocrit 31.5.  On post-op day #1, her hemoglobin was 7.9, two units  of packed RBCs were transfused bringing her hemoglobin up to 9.5.  On March 18, 2004, her hemoglobin dropped again to 8.0, two units of packed RBCs were  transfused bringing her up to 10.9 on March 19, 2004, and on March 20, 2004,  her hemoglobin was 11.8 with a hematocrit of 35.4.  Her reticulocyte count  was 4.2, RBCs 3.04, and sed rate was 75.  Pro time, on admission, was 13.5  seconds with an INR of 1.0, PTT of 27.  On the date of discharge on Coumadin  therapy, her pro time was 17.4 seconds with an INR of 1.7.  C-MET, on  admission, was within normal limits without abnormalities.  Her B-12 level  was 340.  Her folate serum level was greater than 20, and her ferritin level  was 84.  Urinalysis, on admission, showed no abnormalities.   Preoperative x-ray of the left knee showed degenerative changes with no  evidence of acute fracture.  This was taken because she had a fall the  morning of surgery striking her left knee.  Preoperative chest x-ray showed  no acute chest disease with a moderate sized hiatal hernia.   EKG, on admission, showed a normal sinus rhythm with a left anterior  fascicular block.   HOSPITAL COURSE:  The patient underwent left total knee arthroplasty that is  well described in Dr. Luiz Blare operative note on March 15, 2004.  Postoperatively, she was put on a PCA morphine pump for pain control and CPM  machine to the left knee, IV fluids and Ancef 1 gram IV q.8h. x 5 doses  prophylactically.  She was seen by physical therapy for walker ambulation,  weightbearing as tolerated on the left side.  On post-op day #1, she had a  low hemoglobin of 7.9, and her INR was 1.1 on Coumadin therapy.  She was  given two units of packed RBCs which brought her hemoglobin up to 9.5 on  post-op day #2.  She also had some difficulty at this point in time with  some numbness and tingling in the fingers of her right hand with a positive  Tinel sign and positive __________  causing tingling in the fingers.  She  was placed in a wrist splint for this.  On post-op day #2, her IV was  converted to a saline lock.  Her dressing was changed and the  Hemovac drain  was pulled.  A rehab consult was obtained.  On post-op day #2, she dropped  her hemoglobin down to 8.0.  Her INR was 2.3.  [facility] hospitalists were  consulted to evaluate her anemia.  Her pain medications were changed to  Vicodin.  We had an excellent workup per the [facility] hospitalists, and they  suggested two additional units of packed RBCs and did a thorough workup for  her anemia.  On post-op day #4, she was making good progress but lives alone  and needed some assistance at home.  She was felt to be a candidate for  either inpatient rehabilitation subacute unit or possibly a skilled nursing  facility.  On March 19, 2004, the patient was making good progress.  Her  hemoglobin was stable.  On March 20, 2004, the patient was taking fluids and  voiding without difficulty.  Her vital signs were stable.  She was afebrile.  Her hemoglobin was 11.8.  Her INR was 1.7.  She is to be transferred to the  [facility] on March 21, 2004.  She will continue physical therapy there for  walker ambulation, weightbearing as tolerated.  She will also use a home CPM  machine, eight hours per day, starting at 0 degrees, 65 degrees, increasing  5 degrees daily up to 90 degrees.  Her saline lock was discontinued.   MEDICATIONS ON DISCHARGE:  1. Vicodin 5 mg two q.6h. p.r.n. pain.  2. Coumadin per pharmacy protocol x 1 month post-op, shooting for an INR of     2.0.  3. Aciphex 20 mg q.a.m.  4. __________  20 mg b.i.d.  5. Lisinopril 20 mg q.a.m.  6. HCTZ 25 mg q.a.m.  7. Caltrate 600 mg b.i.d.  8. Pravachol 40 mg q.h.s.  9. Effexor XR 150 mg q.a.m.  10.      Xanax 1 mg t.i.d. p.r.n. anxiety.  11.      Prozac 40 mg every other day.  12.      Milk of Magnesia q.h.s.  13.      Fosamax 70 mg once per week.   She will  need to see Dr. Luiz Blare back in his office in ten days.  The patient  will need to call [phone] for an appointment.     Marshia Ly, P.A.                       Harvie Junior,  M.D.    Cordelia Pen  D:  03/20/2004  T:  03/20/2004  Job:  [id number]   cc:   Tenny Craw, Dr.  [facility]   Harvie Junior, M.D.  [address]  [place]  [place] [address]  Fax: [phone]

## 2011-02-07 NOTE — Discharge Summary (Signed)
[name], [name]                 ACCOUNT NO.:  [id number]   MEDICAL RECORD NO.:  [id number]          PATIENT TYPE:  INP   LOCATION:  1516                         FACILITY:  [facility]   PHYSICIAN:  Almedia Balls. Ranell Patrick, M.D. DATE OF BIRTH:  [birth year]   DATE OF ADMISSION:  12/04/2006  DATE OF DISCHARGE:  12/08/2006                               DISCHARGE SUMMARY   ADMISSION DIAGNOSIS:  Left hip pain.   HISTORY:  The patient is an 77 year old female who is complaining about  severe left hip pain, but she denied any known injury.  The patient  presented to the office and was admitted for pain management.   Plain x-rays were negative.  A CT scan showed adductor strain of that  left hip, causing her left hip pain.   The patient was basically admitted for pain management.   HOSPITAL COURSE:  The patient was admitted on December 04, 2006 with  intractable left hip pain.  The patient did have pain with internal and  external rotation of that left hip, but there were no signs of fracture  on her x-ray.  Thus a physical therapy and pain management program were  started.  She continued to work well with therapy until hospital day  three, she was doing much better with decreased pain, and was ready to  be discharged home, with home health physical therapy.   The patient was relieved that she did not have to have any type of  surgical procedure to be performed for this left hip.  She was doing  quite well.  She was afebrile throughout her stay.  Initial laboratory  work showed mildly elevated white blood count at 14.8, some mild anemia,  but otherwise negative.   ADMISSION DIAGNOSIS:  Intractable left hip pain.   DISCHARGE DIAGNOSIS:  Left hip pain, secondary to adductor strain.   DISCHARGE PLAN:  The patient will be discharged home with home health  physical therapy on December 08, 2006.   ALLERGIES:  PERCOCET.   DISCHARGE MEDICATIONS:  1. Xanax 0.25 mg p.o. q.i.d.  2. Wellbutrin 150 mg p.o.  b.i.d.  3. Enablex 7.5 mg p.o. q.d.  4. Cymbalta 90 mg p.o. q.h.s.  5. Lasix 40 mg p.o. b.i.d.  6. Insulin:  One to 11 units subcu t.i.d.  7. Lisinopril 40 mg p.o. q.d.  8. Robaxin 500 mg p.o. q.6h.  9. Protonix 40 mg p.o. q.d.  10.Vicodin one to two tab q.4-6h. p.r.n. pain.  11.Ambien 5 mg p.o. q.h.s. p.r.n.   FOLLOWUP:  The patient will follow up with Dr. Almedia Balls. Norris in two  weeks.      Thomas B. Durwin Nora, P.A.      Almedia Balls. Ranell Patrick, M.D.  Electronically Signed    TBD/MEDQ  D:  12/08/2006  T:  12/08/2006  Job:  [id number]

## 2011-02-07 NOTE — Discharge Summary (Signed)
NAMEDYLYNN, KETNER                 ACCOUNT NO.:  [id number]   MEDICAL RECORD NO.:  [id number]          PATIENT TYPE:  INP   LOCATION:  5735                         FACILITY:  [facility]   PHYSICIAN:  Currie Paris, M.D.DATE OF BIRTH:  Jun 28, 1923   DATE OF ADMISSION:  DATE OF DISCHARGE:  04/09/2006                                 DISCHARGE SUMMARY   PRIMARY CARE PHYSICIAN:  Dr. Gildardo Cranker with [facility].   DISCHARGING PHYSICIAN:  Dr. Jamey Ripa   CHIEF COMPLAINT AND REASON FOR ADMISSION:  Ms. Chernick is an 77 year old  female patient with history of hiatal hernia and gastroesophageal reflux  disease who has had 24 hours worth of abdominal pain.  Temperature in the ER  was 101.  Vital signs were otherwise stable.  White count was 15,900,  hemoglobin 9.2.  CT demonstrated appendicolith with appendicitis.  The  patient had localized pain in the right lower quadrant on exam.  The patient  was admitted by Dr. Daphine Deutscher with a diagnosis of acute appendicitis.   HOSPITAL COURSE:  The patient was taken from the emergency department to the  OR by Dr. Daphine Deutscher, where she underwent a laparoscopic appendectomy and repair  of umbilical hernia.  She was found to have ruptured appendicitis.  Because  of her age and the fact that she had ruptured appendicitis, she was sent to  the PACU initially to recover and then to a step-down bed initially.   On postoperative day 1, the patient was having some difficulty with systolic  hypotension with pressures between 70-85.  Her white count was down to  10,400.  Her hemoglobin was 7.4.  It was felt that her hypotension was due  to a combination of volume depletion and anemia.  A PICC line was placed and  she was transfused with two units of packed red blood cells.  By later in  the day her blood pressure had increased to 109/51.  She had received one  complete unit of packed red blood cells and was in the process of receiving  the second unit.  She reported  feeling much better after the transfusion.   On postoperative day 2, the patient's blood pressure was still somewhat low.  Systolic was 90.  White count was up slightly to 11,500 and hemoglobin was  stable at 9.5.  Electrolytes were stable.  JP drain had a large amount of  drainage, serosanguineous, 140 mL.  In regards to her abdomen, she had mild  abdominal pain at the incisions.  No flatus.  No BM.  She was somewhat dizzy  but had no syncopal episodes.  JP remained with serosanguineous output.  Because of the continued hypotension she was left in step-down unit for  further monitoring.   Over the next several days, the patient continued to slowly improve.  Her  hemoglobin drifted down slightly to 8.8, white count was down to 8,900.  She  developed flatus and clear liquids were offered.  By postoperative day 4,  she was up to the chair.  She was performing incentive spirometry up to 1500  mL.  She had flatus but no BM.  Her abdomen was soft and she was slightly  tender over the staple lines over the stab wounds.  Her bowel sounds were  present and she was tolerating PCA pain medications.  Her blood pressure had  normalized out and her hemoglobin was 9.6, white count 8,100.  Her diet was  advanced and she was switched from PCA pain medications to Vicodin.  She had  been continued on Unasyn postoperatively and this was day 4.  Her IV fluids  were KVO'd and by postoperative day 5, Dr. Jamey Ripa evaluated the patient and  her vital signs were stable, she was tolerating a diet advanced, and she was  deemed appropriate for discharge home.   FINAL DISCHARGE DIAGNOSES:  1. Abdominal pain secondary to acute perforated appendicitis.  2. Status post laparoscopic appendectomy.  3. Hypotension secondary to anemia, status post transfusion of two units      of packed red blood cells, hemoglobin stable.  4. Systolic murmur, echocardiogram done this admission shows mild aortic      stenosis.  5.  Leukocytosis, resolved.   DISCHARGE MEDICATIONS:  1. Resume any prior home medications.  2. Vicodin one every four hours as needed for pain.   DIET:  No restrictions.   ACTIVITY:  Increase activity slowly.  May shower.  May walk up steps.   ADDITIONAL INSTRUCTIONS:  Dr. Jamey Ripa recommended contact from [employer] to help the patient with home feedings.   FOLLOWUP:  She needs to call Dr. Daphine Deutscher to be seen in 1-2 weeks.      [facility]. Rennis [place], N.P.      Currie Paris, M.D.  Electronically Signed    ALE/MEDQ  D:  05/08/2006  T:  05/09/2006  Job:  [id number]

## 2011-02-07 NOTE — Procedures (Signed)
[facility]  Patient:    [name], [name] Visit Number: [id number] MRN: [id number]          Service Type: END Location: ENDO Attending Physician:  Louie Bun Dictated by:   Everardo All Madilyn Fireman, M.D. Proc. Date: 11/05/01 Admit Date:  11/05/2001   CC:         Jamesetta Geralds, M.D.   Procedure Report  PROCEDURE:  Colonoscopy.  INDICATION FOR PROCEDURE:  Severe anemia with hemoglobin 7.1.  DESCRIPTION OF PROCEDURE:  The patient was placed in the left lateral decubitus position and placed on the pulse monitor with continuous low-flow oxygen delivered by nasal cannula.  She was sedated with 20 mg of IV Demerol and 1 mg of IV Versed.  The Olympus video colonoscope was inserted into the rectum and advanced to the cecum, confirmed by transillumination at McBurneys point and visualization of the ileocecal valve and appendiceal orifice.  The prep was excellent.  The cecum revealed an ulcerated area just opposite and slightly distal to the ileocecal valve.  It was covered with whitish exudate and has some depth of surrounding erythema and granularity.  The terminal ileum was intubated and appeared normal with no evidence of ulceration or inflammation.  Biopsies of the ulcerated area were obtained.  There was no visible suspicion of malignancy, but this could not be conclusively ruled out by visual inspection.  The ascending colon mucosa distal to the ulcer appeared normal, as did the transverse, descending, sigmoid, and rectum.  Specifically, no polyp, no further ulcerations, polyps, masses, diverticula or visible mucosal abnormalities were seen.  Retroflexed view of the anus revealed no obvious internal hemorrhoids.  The colonoscope was then withdrawn, and the patient returned to the recovery room in stable condition.  She tolerated the procedure well, and there were no immediate complications.  IMPRESSION:  Cecal ulceration, etiology not clear, with no reported  history of nonsteroidal anti-inflammatory drugs.  PLAN:  Will await biopsies to assess the likely etiology of being ischemic or possibly Crohns colitis.  Also await biopsies from previous EGD. Dictated by:   Everardo All Madilyn Fireman, M.D. Attending Physician:  Louie Bun DD:  11/05/01 TD:  11/06/01 Job: [id number]

## 2011-02-07 NOTE — Op Note (Signed)
NAMEZITLALY, MALSON                           ACCOUNT NO.:  [id number]   MEDICAL RECORD NO.:  [id number]                   PATIENT TYPE:  INP   LOCATION:  5008                                 FACILITY:  [facility]   PHYSICIAN:  Harvie Junior, M.D.                DATE OF BIRTH:  21-Apr-1923   DATE OF PROCEDURE:  03/15/2004  DATE OF DISCHARGE:  03/21/2004                                 OPERATIVE REPORT   PREOPERATIVE DIAGNOSIS:  End stage degenerative joint disease, left knee,  with severe valgus deformity.   POSTOPERATIVE DIAGNOSIS:  End stage degenerative joint disease, left knee,  with severe valgus deformity.   PROCEDURE:  Left total knee replacement with Laural Benes and Laural Benes Sigma  system done through a lateral parapatellar approach.   SURGEON:  Harvie Junior, M.D.   ASSISTANT:  Marshia Ly, P.A.   ANESTHESIA:  General.   BRIEF HISTORY:  77 year old female with a long history of having severe end  stage degenerative joint disease of bilateral knees.  She has been followed  conservatively for greater than a year with failure of all conservative care  including anti-inflammatory medications, activity modification, use of a  cane.  Because of continued complaints of pain and failure of all  conservative care, she is taken to the operating room for total knee  arthroplasty.  She was noted to have severe valgus deformity preoperatively  and thoughts toward a lateral parapatellar approach were entertained  preoperatively.  The patient was taken to the operating room for this  procedure.   DESCRIPTION OF PROCEDURE:  The patient was brought to the operating room and  after adequate anesthesia was obtained with a general anesthetic, the  patient was placed on the operating table.  The left leg was prepped and  draped in the usual sterile fashion.  Following this, the leg was examined  under anesthesia and noted to have a competent medial collateral ligament  and noted to have a 35  degree valgus deformity.  At that point, it was felt  that a lateral parapatellar approach would be the most appropriate course of  action.  Following this, a curved skin incision was made to follow the  valgus deformity and the subcutaneous tissues were dissected down to the  level of the extensor mechanism.  The extensor mechanism was cleared on a  lateral side and a lateral parapatellar arthrotomy was undertaken following  down onto the tibia well and releasing completely the iliotibial band from  this side.  This was reflected all the way around posteriorly and a small  portion of the popliteus and lateral collateral ligament were taken down off  the femur to achieve straight alignment at this point.  Following this, the  jigs were put in place and the tibia was cut perpendicular to its long axis.  The attention was turned to the femur where the distal femur was  resected  perpendicular to its long axis.  The 5 degree valgus alignment was chosen at  this point.  Following this, the knee was sized and sized to be a size 3 and  the anterior and posterior cuts were made.  The chamfers were then cut.  The  flexion extension gap had been evaluated prior to cutting of the chamfers.  At this point, the attention was turned to the tibia where the tibia was  sized to a size 2.5.  The keel was punched at this point.  The wound was  copiously irrigated.  The patella was identified and cut and sized to a 32  and holes were drilled for the patella.  The knee was put through a range of  motion, at this point there was excellent range of motion with no  instability and excellent alignment balance was achieved.  At this point,  the knee was copiously irrigated and suctioned dry.  The final components  were then cemented into place with antibiotics impregnated into the cement.  A size 3 femur, a size 2.5 tibia, 32 mm all poly patella, with a 15 mm  insert were chosen and, again, excellent stability and  excellent range of  motion were achieved at this point.  A near perfect alignment was achieved  with a 6 degree valgus deformity.  The curved incision that initially was  made was now straight.  After the final components were allowed to harden  into place, the knee was put through a range of motion and excellent range  of motion was achieved. No tendency towards instability or abnormality.  A  medium Hemovac drain with a single limb was placed.  The lateral  parapatellar arthrotomy was closed with a #1 Vicryl running suture.  The  skin was closed with 2-0 Vicryl and staples.  A sterile compression dressing  was applied as well as a knee immobilizer.  The patient was taken to the  recovery room where she was noted to be in satisfactory condition.  Estimated blood loss for the procedure was none.                                               Harvie Junior, M.D.    Ranae Plumber  D:  04/24/2004  T:  04/24/2004  Job:  [id number]

## 2011-02-07 NOTE — Op Note (Signed)
NAMESHERION, DOOLY                           ACCOUNT NO.:  [id number]   MEDICAL RECORD NO.:  [id number]                   PATIENT TYPE:  INP   LOCATION:  3307                                 FACILITY:  [facility]   PHYSICIAN:  John C. Madilyn Fireman, M.D.                 DATE OF BIRTH:  November 24, 1922   DATE OF PROCEDURE:  03/27/2004  DATE OF DISCHARGE:                                 OPERATIVE REPORT   PROCEDURE:  Esophagogastroduodenoscopy.   INDICATIONS:  Coffee-grounds emesis and anemia in a patient on Coumadin.   PROCEDURE:  The patient was placed in the left lateral decubitus position  and placed on the pulse monitor with continuous low-flow oxygen delivered by  nasal cannula.  She was sedated with 40 mcg IV fentanyl and 4 mg IV Versed.  The Olympus video endoscope was advanced under direct vision into the  oropharynx and the esophagus.  The esophagus was straight and of normal  caliber with the squamocolumnar line at 32 cm above a 5 cm hiatal hernia.  There was a questionable patent lower esophageal ring, no Mallory-Weiss  tear, esophagitis, or abnormality to the esophagus or GE junction.  The  stomach was entered and a small amount of liquid secretions were suctioned  from the fundus.  Retroflexed view of the cardia confirmed a large hiatal  hernia.  It was otherwise unremarkable.  The fundus, body, antrum, and  pylorus all appeared normal.  The duodenum was entered and both the bulb and  second portion were well-inspected and appeared to be within normal limits.  The scope was then withdrawn and the patient returned to the recovery room  in stable condition.  She tolerated the procedure well, and there were no  immediate complications.   IMPRESSION:  Large hiatal hernia, otherwise normal study.   PLAN:  1. Empiric treatment with a proton pump inhibitor and observe for further     evidence of GI bleed.  2. Adjust Coumadin to the lowest acceptable INR.                 John C. Madilyn Fireman, M.D.    JCH/MEDQ  D:  03/28/2004  T:  03/28/2004  Job:  [id number]   cc:   C. Duane Lope, M.D.  [address]  [place]  [place] [address]  Fax: [phone]

## 2011-02-07 NOTE — Discharge Summary (Signed)
[name], [name]                           ACCOUNT NO.:  [id number]   MEDICAL RECORD NO.:  [id number]                   PATIENT TYPE:  INP   LOCATION:  5033                                 FACILITY:  [facility]   PHYSICIAN:  Sherin Quarry, MD                   DATE OF BIRTH:  Feb 25, 1923   DATE OF ADMISSION:  03/27/2004  DATE OF DISCHARGE:  04/04/2004                                 DISCHARGE SUMMARY   HISTORY OF PRESENT ILLNESS:  [name] is a 77 year old lady with a past  history of diabetes, hypertension and osteoporosis, who was discharged from  [facility], status post left total knee replacement on March 21, 2004.  She was placed on Coumadin for DVT prophylaxis.  On March 26, 2004, she  suddenly began vomiting blood; she was also noted to have melenic stools.  Hemoglobin was documented to drop from 12.1 on March 23, 2004 to 6.9 on March 27, 2004.  INR in the emergency room was found to be 3.  In light of this  presentation and the acute hematemesis, the patient was admitted to the  hospital.   PHYSICAL EXAMINATION:  Physical exam at the time of admission as described  by Dr. Hollice Espy:  VITAL SIGNS:  The temperature was 97.2, heart rate was 98, respirations 18,  blood pressure was 108/56.  GENERAL:  The patient was noted to be in mild  distress.  HEENT:  Exam was within normal limits.  CHEST:  The chest was clear to auscultation and percussion.  CARDIOVASCULAR:  Exam revealed normal S1 and S2, without rubs, murmurs or  gallops.  ABDOMEN:  The abdomen was soft.  It was slightly tender.  There was no focal  tenderness.  It was nondistended.  Bowel sounds were described as  hypoactive.  RECTAL:  Stool was heme-positive.  EXTREMITIES:  The knee wound was clean and dry; no erythema was noted.   LABORATORY STUDIES:  Laboratory studies included a sodium of 132, potassium  of 4.7, chloride of 100, CO2 of 24.  BUN was 79, creatinine 1.2, consistent  with a diagnosis of  gastrointestinal bleeding.  Glucose was 103.  Liver  functions were remarkable for an albumin of 2.6.   HOSPITAL COURSE:  On admission, the patient was made n.p.o., intravenous  fluids were administered as well as packed red blood cells.  Consultation  was obtained from [facility].  On March 28, 2004, upper endoscopy  was performed; no source of bleeding could be identified.  The patient was  noted to have a 5-cm hiatal hernia.  Naturally, in light of the patient's  presentation, Coumadin anticoagulation was discontinued and the patient was  placed on thigh-high TED hose for DVT prophylaxis.  In light of the  patient's presentation and the negative endoscopy, it was felt prudent to  keep the patient in the hospital  until a capsule endoscopy could be  performed; this procedure was done on April 03, 2004; the results of this  procedure are pending.  During the course of the patient's hospitalization,  she complained of leg pain.  Venous Doppler studies were performed; these  were negative for deep vein thrombophlebitis.  The hemoglobin stabilized in  the range of 9.0 to 9.5.  There was no further evidence of gastrointestinal  bleeding.  During the course of the hospitalization, the patient was seen  and counseled by Dr. Harvie Junior and colleagues, in light of her total  knee replacement.  By April 04, 2004, the patient appeared to be clinically  stable.  Decision was made to discharge at that time.   DISCHARGE DIAGNOSES:  1. Hematemesis, exacerbated by Coumadin therapy, etiology uncertain, status     post blood transfusion.  2. Osteoporosis.  3. Recent total knee replacement.  4. Hypertension.  5. Diabetes.  6. Depression.  7. Hyperlipidemia.   DISCHARGE MEDICATIONS:  Discharge medications will consist of:  1. Effexor 150 mg daily.  2. Protonix 40 mg daily.  3. Pravachol 40 mg daily.  4. Prozac 40 mg daily.  5. Detrol LA 2 mg daily.  6. Ambien 5 mg at bedtime p.r.n. for  sleep.  7. Xanax 1 mg p.o. t.i.d. p.r.n. for anxiety.   FOLLOWUP:  The patient will be followed by physicians at the [facility]; I  believe this is Dr. Ann Maki T. Stoneking.  We will make sure that arrangements  are made for Dr. Everardo All. Madilyn Fireman to discuss the results of capsule endoscopy  with the patient when these results are known.   CONDITION AT TIME OF DISCHARGE:  The patient's condition at time of  discharge was good.                                                Sherin Quarry, MD    SY/MEDQ  D:  04/04/2004  T:  04/04/2004  Job:  [id number]   cc:   Everardo All. Madilyn Fireman, M.D.  [address], [address]  [place]  [place] [address]  Fax: [phone]   Harvie Junior, M.D.  [address]  [place]  [place] [address]  Fax: [phone]   C. Duane Lope, M.D.  [address]  [place]  [place] [address]  Fax: [phone]   [facility]

## 2011-02-07 NOTE — Op Note (Signed)
[name], [name]                 ACCOUNT NO.:  [id number]   MEDICAL RECORD NO.:  [id number]          PATIENT TYPE:  INP   LOCATION:  2550                         FACILITY:  [facility]   PHYSICIAN:  Thornton Park. Daphine Deutscher, MD  DATE OF BIRTH:  [birth year]   DATE OF PROCEDURE:  04/04/2006  DATE OF DISCHARGE:                                 OPERATIVE REPORT   PREOPERATIVE DIAGNOSIS:  Acute appendicitis.   POSTOPERATIVE DIAGNOSIS:  Ruptured acute appendicitis.   PROCEDURE:  Laparoscopic appendectomy and repair of umbilical hernia   SURGEON:  Molli Hazard B. Daphine Deutscher, M.D.   DRAINS:  One Blake drain in the right lower quadrant.   DESCRIPTION OF PROCEDURE:  Ms. Smylie was taken to room 16 on Saturday, April 04, 2006, and given general anesthesia.  Preoperatively she received Unasyn.  The abdomen was prepped with chlorhexidine and draped sterilely.  Her  umbilicus was completely replaced with what appeared to be a large fungating  wart.  I, therefore, used OptiVu technique in the left upper quadrant, and  entered without difficulty and insufflated.  I had to switch out 11 and 12  and put a 5 in the right upper quadrant.  The appendix was matted with pus  area where in the right lower quadrant which I controlled with suction.  I  found her to have a perforated appendix along the shank with free stool  which as I manipulated, I continued to control with suction and remove.  The  terminal ileal appendage was stuck to this.  I eventually had to take it off  and it became a little dusky but the terminal ileum looked fine.  Finally, I  was able to mobilize this after working for probably about an hour.  I went  through the mesentery with the Harmonic scalpel.  I did have to put some  clips to control a couple of prominent arterial vessels.  I then was able to  skeletonize the base, although I had to do a partial cecectomy in that this  went right down onto the base.  I went ahead and stapled across the base  of  the cecum with three applications of the Endo-GIA 45 and put this in a bag.  In order to get this out, I could not get it out through the trocar, so I  went ahead and cut out the navel into this underlying umbilical hernia.  I  brought it out easily and then closed this umbilical defect with five simple  sutures of 0 Prolene.  I went back and irrigated and there was no bleeding  noted.  A 19 Blake drain was placed in the 5 mm trocar in the upper abdomen  and secured.  We had withdrawn all the irrigant and I had used a lot of  irrigant in the case and then irrigated and closed wounds with staples.  The  patient will be kept on Unasyn.  The patient was taken to the recovery room  and then to step-down.      Thornton Park Daphine Deutscher, MD  Electronically Signed  MBM/MEDQ  D:  04/04/2006  T:  04/04/2006  Job:  [id number]   cc:   C. Duane Lope, M.D.  Fax: [phone]

## 2011-02-07 NOTE — Discharge Summary (Signed)
NAMECONITA, AMENTA                 ACCOUNT NO.:  [id number]   MEDICAL RECORD NO.:  [id number]          PATIENT TYPE:  INP   LOCATION:  1606                         FACILITY:  [facility]   PHYSICIAN:  Madlyn Frankel. Charlann Boxer, M.D.  DATE OF BIRTH:  Aug 27, 1923   DATE OF ADMISSION:  08/23/2007  DATE OF DISCHARGE:  08/26/2007                               DISCHARGE SUMMARY   ADMITTING DIAGNOSES:  1. Cellulitis.  2. Osteoarthritis.  3. Depression.  4. Hypertension.  5. Gastroesophageal reflux disease.  6. Incontinence.  7. Anxiety.   DISCHARGE DIAGNOSIS:  1. Osteoarthritis.  2. Hypertension.  3. Gastroesophageal reflux disease.  4. Anxiety.  5. Hypertension.  6. Umbilical hernia.   CONSULTANTS:  Pharmacy for vancomycin administration.   PROCEDURE:  None.   LABORATORY DATA:  An admission CBC revealed hematocrit 35, white blood  cells 7.5.  White cell differential normal.  Sedimentation rate was 45.  Coags negative.  Routine chemistry with no significant abnormalities.  C-  reactive protein was 2.6.  Vancomycin trough was 15.4.  UA with a trace  of leukocyte esterase.   RADIOLOGY:  Venous Doppler negative.  Abdominal CT with contrast.  Impression - normal findings.  No evidence of pelvic vein thrombosis.  Enhancement of the veins of the lower extremities is mildly limited.  Normal pelvic bowel loops.  No ascites.  Mildly prominent pelvic lymph  nodes with index right external iliac node measuring 1 cm.  A normal  urinary bladder, uterus and adnexa for age.  There was also a ventral  hernia seen.  Large hiatal hernia.  Narrowing of the hepatic flexor of  the colon.  A left renal angiomyolipoma.  Nodularity of the lung bases.  Probable lower thoracic mild compression deformities.   Cardiology EKG normal sinus rhythm.   HOSPITAL COURSE:  The patient was seen in our office with persistent  right swelling of the leg with some cellulitic changes in her ankle and  foot.  She was admitted to  the hospital for further follow up and  diagnostic studies to rule out cellulitis versus DVT.  She was admitted  and labs, a sedimentation rate and CRP, were elevated but very well  attributed to her recent right total knee replacement.  She was started  on IV vancomycin, some Lasix and K-Dur, and a Doppler was performed  which was negative.  The next day, she still had the persistent  swelling.  There was no pain with knee range of motion.  We had  aspirated her knee and also were awaiting aspirate results.  When those  came in later on that day, they were negative.  The next day, the  patient was seen and had no complaints.  The only complaint was some  difficulty with bowel movements.  Continued to have no pain with knee  range of motion.  We discontinued the IV antibiotics, as well as the  Levaquin.  We ordered some graded compression stockings for her as a  final treatment.   DISCHARGE INSTRUCTIONS:   DIET:  Regular.   DISCHARGE WOUND CARE:  None.  DISCHARGE ACTIVITY:  Walk with assistance as needed.   DISCHARGE SPECIAL INSTRUCTIONS:  1. Wear compression stockings - on during the day and off at night.  2. Follow up with Dr. Charlann Boxer at [phone] in 1 week.Marland Kitchen   DISCHARGE MEDICATIONS:  1. Pantoprazole 40 mg p.o. daily.  2. Lisinopril 40 mg p.o. daily.  3. Cymbalta 90 mg p.o. daily.  4. Wellbutrin 150 mg p.o. b.i.d.  5. Alprazolam 0.5 mg half tablet p.o. q.i.d. p.r.n.     ______________________________  Yetta Glassman. [address], [place]      Madlyn Frankel. Charlann Boxer, M.D.  Electronically Signed    BLM/MEDQ  D:  09/06/2007  T:  09/06/2007  Job:  [id number]

## 2011-06-17 LAB — URINE MICROSCOPIC-ADD ON

## 2011-06-17 LAB — BASIC METABOLIC PANEL
BUN: 17
Calcium: 9.7
GFR calc non Af Amer: 49 — ABNORMAL LOW
Potassium: 4.1
Sodium: 141

## 2011-06-17 LAB — URINALYSIS, ROUTINE W REFLEX MICROSCOPIC
Hgb urine dipstick: NEGATIVE
Ketones, ur: NEGATIVE
Protein, ur: NEGATIVE
Urobilinogen, UA: 0.2

## 2011-06-17 LAB — HEMOGLOBIN AND HEMATOCRIT, BLOOD: Hemoglobin: 11.8 — ABNORMAL LOW

## 2011-06-30 LAB — SEDIMENTATION RATE: Sed Rate: 45 — ABNORMAL HIGH

## 2011-06-30 LAB — CBC
MCHC: 33.5
Platelets: 347
RBC: 3.99
RDW: 16.1 — ABNORMAL HIGH

## 2011-06-30 LAB — URINALYSIS, ROUTINE W REFLEX MICROSCOPIC
Bilirubin Urine: NEGATIVE
Glucose, UA: NEGATIVE
Hgb urine dipstick: NEGATIVE
Ketones, ur: NEGATIVE
Nitrite: NEGATIVE
Specific Gravity, Urine: 1.013
pH: 8

## 2011-06-30 LAB — BASIC METABOLIC PANEL
Calcium: 9.4
Chloride: 101
Creatinine, Ser: 0.71
GFR calc Af Amer: >60
GFR calc non Af Amer: >60

## 2011-06-30 LAB — PROTIME-INR
INR: 1
Prothrombin Time: 13.7

## 2011-06-30 LAB — URINE MICROSCOPIC-ADD ON

## 2011-06-30 LAB — C-REACTIVE PROTEIN: CRP: 2.6 — ABNORMAL HIGH (ref <0.6)

## 2011-06-30 LAB — DIFFERENTIAL
Basophils Absolute: 0
Basophils Relative: 0
Eosinophils Absolute: 0.3
Neutrophils Relative %: 62

## 2011-06-30 LAB — VANCOMYCIN, TROUGH: Vancomycin Tr: 15.4

## 2011-07-03 LAB — COMPREHENSIVE METABOLIC PANEL
AST: 22
Albumin: 3.7
Alkaline Phosphatase: 137 — ABNORMAL HIGH
BUN: 15
Chloride: 110
GFR calc Af Amer: >60
Potassium: 4.4
Sodium: 144
Total Bilirubin: 0.5
Total Protein: 5.9 — ABNORMAL LOW

## 2011-07-03 LAB — TYPE AND SCREEN: Antibody Screen: NEGATIVE

## 2011-07-03 LAB — HEMOGLOBIN AND HEMATOCRIT, BLOOD
HCT: 17.7 — ABNORMAL LOW
Hemoglobin: 6.1 — CL
Hemoglobin: 6.4 — CL

## 2011-07-03 LAB — BASIC METABOLIC PANEL
BUN: 16
Chloride: 107
Creatinine, Ser: 0.74
Creatinine, Ser: 0.79
GFR calc Af Amer: >60
GFR calc non Af Amer: >60
GFR calc non Af Amer: >60
Glucose, Bld: 133 — ABNORMAL HIGH
Potassium: 3.9

## 2011-07-03 LAB — URINALYSIS, ROUTINE W REFLEX MICROSCOPIC
Glucose, UA: NEGATIVE
Ketones, ur: NEGATIVE
Nitrite: NEGATIVE
Protein, ur: NEGATIVE
Urobilinogen, UA: 0.2

## 2011-07-03 LAB — CBC
MCHC: 35.2
MCV: 90.1
MCV: 91.6
MCV: 91.6
Platelets: 260
Platelets: 264
Platelets: 343
RBC: 2.55 — ABNORMAL LOW
RBC: 2.82 — ABNORMAL LOW
RDW: 14.1 — ABNORMAL HIGH
RDW: 15.4 — ABNORMAL HIGH
RDW: 15.1 — ABNORMAL HIGH
WBC: 7.4
WBC: 8.3
WBC: 5.5

## 2011-07-03 LAB — PROTIME-INR: INR: 1

## 2011-07-03 LAB — PREPARE RBC (CROSSMATCH)

## 2011-07-04 LAB — BASIC METABOLIC PANEL
Calcium: 9.6
Creatinine, Ser: 0.93
GFR calc Af Amer: >60
Sodium: 142

## 2011-12-05 ENCOUNTER — Ambulatory Visit (INDEPENDENT_AMBULATORY_CARE_PROVIDER_SITE_OTHER): Payer: Medicare Other | Admitting: Surgery

## 2011-12-05 ENCOUNTER — Encounter (INDEPENDENT_AMBULATORY_CARE_PROVIDER_SITE_OTHER): Payer: Self-pay | Admitting: Surgery

## 2011-12-05 VITALS — BP 186/78 | HR 72 | Temp 98.8°F | Resp 20 | Ht 59.0 in | Wt 150.6 lb

## 2011-12-05 DIAGNOSIS — K439 Ventral hernia without obstruction or gangrene: Secondary | ICD-10-CM

## 2011-12-05 NOTE — Progress Notes (Signed)
Marcelline Deist 78 y.o.  Body mass index is 30.42 kg/(m^2).  There is no problem list on file for this patient.   No Known Allergies  Past Surgical History  Procedure Date  . Umbilical hernia repair   . Total knee arthroplasty     right  . Total knee arthroplasty     left   Daisy Floro, MD, MD No diagnosis found.  Feeling that things are protruding out.  Repair is intact however.  She had physiomesh inserted and tacked.  Large piece with at least 3 cm overlap Matt B. Daphine Deutscher, MD, [facility] [phone] beeper [phone]  12/05/2011 3:18 PM

## 2014-08-11 ENCOUNTER — Encounter (HOSPITAL_COMMUNITY): Payer: Self-pay

## 2014-08-11 ENCOUNTER — Emergency Department (HOSPITAL_COMMUNITY): Payer: Medicare Other

## 2014-08-11 ENCOUNTER — Emergency Department (HOSPITAL_COMMUNITY)
Admission: EM | Admit: 2014-08-11 | Discharge: 2014-08-12 | Disposition: A | Discharge status: Home / Self Care | Payer: Medicare Other | Attending: Emergency Medicine | Admitting: Emergency Medicine

## 2014-08-11 DIAGNOSIS — I10 Essential (primary) hypertension: Secondary | ICD-10-CM | POA: Insufficient documentation

## 2014-08-11 DIAGNOSIS — Z792 Long term (current) use of antibiotics: Secondary | ICD-10-CM | POA: Insufficient documentation

## 2014-08-11 DIAGNOSIS — D320 Benign neoplasm of cerebral meninges: Secondary | ICD-10-CM | POA: Diagnosis not present

## 2014-08-11 DIAGNOSIS — D329 Benign neoplasm of meninges, unspecified: Secondary | ICD-10-CM

## 2014-08-11 DIAGNOSIS — Y92002 Bathroom of unspecified non-institutional (private) residence single-family (private) house as the place of occurrence of the external cause: Secondary | ICD-10-CM | POA: Diagnosis not present

## 2014-08-11 DIAGNOSIS — S0990XA Unspecified injury of head, initial encounter: Secondary | ICD-10-CM | POA: Diagnosis present

## 2014-08-11 DIAGNOSIS — I6523 Occlusion and stenosis of bilateral carotid arteries: Secondary | ICD-10-CM | POA: Diagnosis not present

## 2014-08-11 DIAGNOSIS — S0101XA Laceration without foreign body of scalp, initial encounter: Secondary | ICD-10-CM | POA: Insufficient documentation

## 2014-08-11 DIAGNOSIS — Y9301 Activity, walking, marching and hiking: Secondary | ICD-10-CM | POA: Insufficient documentation

## 2014-08-11 DIAGNOSIS — W01198A Fall on same level from slipping, tripping and stumbling with subsequent striking against other object, initial encounter: Secondary | ICD-10-CM | POA: Insufficient documentation

## 2014-08-11 DIAGNOSIS — Z09 Encounter for follow-up examination after completed treatment for conditions other than malignant neoplasm: Secondary | ICD-10-CM

## 2014-08-11 DIAGNOSIS — Z7982 Long term (current) use of aspirin: Secondary | ICD-10-CM | POA: Insufficient documentation

## 2014-08-11 DIAGNOSIS — Y998 Other external cause status: Secondary | ICD-10-CM | POA: Insufficient documentation

## 2014-08-11 DIAGNOSIS — Z79899 Other long term (current) drug therapy: Secondary | ICD-10-CM | POA: Diagnosis not present

## 2014-08-11 DIAGNOSIS — W19XXXA Unspecified fall, initial encounter: Secondary | ICD-10-CM

## 2014-08-11 LAB — BASIC METABOLIC PANEL
ANION GAP: 10 (ref 5–15)
BUN: 14 mg/dL (ref 6–23)
CHLORIDE: 95 meq/L — AB (ref 96–112)
CO2: 28 meq/L (ref 19–32)
Calcium: 9.6 mg/dL (ref 8.4–10.5)
Creatinine, Ser: 0.71 mg/dL (ref 0.50–1.10)
GFR calc non Af Amer: 74 mL/min — ABNORMAL LOW (ref >90)
GFR, EST AFRICAN AMERICAN: 86 mL/min — AB (ref >90)
Glucose, Bld: 128 mg/dL — ABNORMAL HIGH (ref 70–99)
Potassium: 3.3 mEq/L — ABNORMAL LOW (ref 3.7–5.3)
SODIUM: 133 meq/L — AB (ref 137–147)

## 2014-08-11 LAB — CBC WITH DIFFERENTIAL/PLATELET
BASOS ABS: 0 10*3/uL (ref 0.0–0.1)
BASOS PCT: 0 % (ref 0–1)
Eosinophils Absolute: 0.2 10*3/uL (ref 0.0–0.7)
Eosinophils Relative: 2 % (ref 0–5)
HEMATOCRIT: 35.2 % — AB (ref 36.0–46.0)
Hemoglobin: 11.4 g/dL — ABNORMAL LOW (ref 12.0–15.0)
LYMPHS PCT: 12 % (ref 12–46)
Lymphs Abs: 0.9 10*3/uL (ref 0.7–4.0)
MCH: 30.4 pg (ref 26.0–34.0)
MCHC: 32.4 g/dL (ref 30.0–36.0)
MCV: 93.9 fL (ref 78.0–100.0)
Monocytes Absolute: 0.8 10*3/uL (ref 0.1–1.0)
Monocytes Relative: 11 % (ref 3–12)
NEUTROS ABS: 5.6 10*3/uL (ref 1.7–7.7)
NEUTROS PCT: 75 % (ref 43–77)
PLATELETS: 315 10*3/uL (ref 150–400)
RBC: 3.75 MIL/uL — ABNORMAL LOW (ref 3.87–5.11)
RDW: 13.4 % (ref 11.5–15.5)
WBC: 7.4 10*3/uL (ref 4.0–10.5)

## 2014-08-11 NOTE — ED Notes (Signed)
Bed: [facility] Expected date:  Expected time:  Means of arrival:  Comments: EMS-fall 

## 2014-08-11 NOTE — ED Notes (Signed)
Bed: VK12 Expected date:  Expected time:  Means of arrival:  Comments: Need to use so depends can be changed.

## 2014-08-11 NOTE — ED Provider Notes (Signed)
CSN: [id number]     Arrival date & time 08/11/14  1503 History   First MD Initiated Contact with Patient 08/11/14 1514     Chief Complaint  Patient presents with  . Fall  . Head Injury     (Consider location/radiation/quality/duration/timing/severity/associated sxs/prior Treatment) HPI Comments: Patient is an 80 year old female with a past medical history of hypertension, arthritis, and anemia who presents from home via EMS after a mechanical fall that occurred prior to arrival. Patient reports she was walking to the bathroom when she stubbed her toe and fell backwards, hitting her head on the corner of the wall. She walks with a walker at baseline and could not fit it into the bathroom so she left outside the bathroom. She then stubbed her toe and fell. Patient denies LOC. She does not take anticoagulants. She reports moderate, throbbing headache in the occipital area that does not radiation. She denies any other injury. She reports associated laceration where she hit her head. No other associated symptoms. No aggravating/alleviating factors.    Past Medical History  Diagnosis Date  . Anemia   . Arthritis   . Hypertension   . History of umbilical hernia repair    Past Surgical History  Procedure Laterality Date  . Umbilical hernia repair    . Total knee arthroplasty      right  . Total knee arthroplasty      left   Family History  Problem Relation Age of Onset  . Heart disease Father    History  Substance Use Topics  . Smoking status: Never Smoker   . Smokeless tobacco: Not on file  . Alcohol Use: No   OB History    No data available     Review of Systems  Constitutional: Negative for fever, chills and fatigue.  HENT: Negative for trouble swallowing.   Eyes: Negative for visual disturbance.  Respiratory: Negative for shortness of breath.   Cardiovascular: Negative for chest pain and palpitations.  Gastrointestinal: Negative for nausea, vomiting, abdominal pain and  diarrhea.  Genitourinary: Negative for dysuria and difficulty urinating.  Musculoskeletal: Negative for arthralgias.  Skin: Positive for wound. Negative for color change.  Neurological: Positive for headaches. Negative for dizziness and weakness.  Psychiatric/Behavioral: Negative for dysphoric mood.      Allergies  Review of patient's allergies indicates no known allergies.  Home Medications   Prior to Admission medications   Medication Sig Start Date End Date Taking? Authorizing Provider  ALPRAZolam Duanne Moron) 0.5 MG tablet Take 0.5 mg by mouth at bedtime as needed for anxiety (anxiety).    Yes Historical Provider, MD  amLODipine (NORVASC) 2.5 MG tablet Take 2.5 mg by mouth daily.   Yes Historical Provider, MD  aspirin 81 MG tablet Take 81 mg by mouth daily.   Yes Historical Provider, MD  buPROPion (ZYBAN) 150 MG 12 hr tablet Take 150 mg by mouth 2 (two) times daily.   Yes Historical Provider, MD  carvedilol (COREG) 12.5 MG tablet Take 12.5 mg by mouth 2 (two) times daily with a meal.   Yes Historical Provider, MD  doxycycline (VIBRA-TABS) 100 MG tablet Take 100 mg by mouth 2 (two) times daily.   Yes Historical Provider, MD  ferrous sulfate 325 (65 FE) MG tablet Take 325 mg by mouth 2 (two) times daily with a meal.    Yes Historical Provider, MD  FLUoxetine (PROZAC) 40 MG capsule Take 40 mg by mouth daily.   Yes Historical Provider, MD  furosemide (  LASIX) 40 MG tablet Take 40 mg by mouth daily.   Yes Historical Provider, MD  HYDROcodone-homatropine (HYCODAN) 5-1.5 MG/5ML syrup Take 5 mLs by mouth at bedtime.   Yes Historical Provider, MD  levothyroxine (SYNTHROID, LEVOTHROID) 50 MCG tablet Take 50 mcg by mouth daily before breakfast.   Yes Historical Provider, MD  lisinopril (PRINIVIL,ZESTRIL) 40 MG tablet Take 40 mg by mouth daily.   Yes Historical Provider, MD  pantoprazole (PROTONIX) 20 MG tablet Take 20 mg by mouth daily.   Yes Historical Provider, MD   There were no vitals taken for  this visit. Physical Exam  Constitutional: She is oriented to person, place, and time. She appears well-developed and well-nourished. No distress.  HENT:  Head: Normocephalic and atraumatic.  Mouth/Throat: Oropharynx is clear and moist. No oropharyngeal exudate.  Eyes: Conjunctivae and EOM are normal. Pupils are equal, round, and reactive to light.  Neck: Normal range of motion.  Cardiovascular: Normal rate and regular rhythm.  Exam reveals no gallop and no friction rub.   No murmur heard. Pulmonary/Chest: Effort normal and breath sounds normal. She has no wheezes. She has no rales. She exhibits no tenderness.  Abdominal: Soft. She exhibits no distension. There is no tenderness. There is no rebound.  Musculoskeletal: Normal range of motion.  No midline spine tenderness to palpation. Stable pelvis.   Neurological: She is alert and oriented to person, place, and time. No cranial nerve deficit. Coordination normal.  Speech is goal-oriented. Moves limbs without ataxia.   Skin: Skin is warm and dry.  Abrasion noted to left elbow. 3cm laceration to the occipital scalp with bleeding controlled. There is a hematoma of the occipital scalp that is tender to palpation.   Psychiatric: She has a normal mood and affect. Her behavior is normal.  Nursing note and vitals reviewed.   ED Course  Procedures (including critical care time) Labs Review Labs Reviewed - No data to display  Imaging Review Ct Head Wo Contrast  08/11/2014   CLINICAL DATA:  Walking out of bathroom and stubbed her toe, struck head on wall, posterior hematoma and laceration, fall, initial encounter  EXAM: CT HEAD WITHOUT CONTRAST  CT CERVICAL SPINE WITHOUT CONTRAST  TECHNIQUE: Multidetector CT imaging of the head and cervical spine was performed following the standard protocol without intravenous contrast. Multiplanar CT image reconstructions of the cervical spine were also generated.  COMPARISON:  None.  FINDINGS: CT HEAD FINDINGS   Normal ventricular morphology.  No midline shift or mass effect.  Large calcified mass RIGHT parasellar, 2.5 x 2.3 cm.  Brain parenchyma otherwise normal in appearance.  No additional mass, intracranial hemorrhage, or evidence acute infarction.  No extra-axial fluid collections.  Calvaria intact.  No skullbase bony changes identified adjacent to the calcified mass.  Atherosclerotic calcifications of the carotid siphons bilaterally.  CT CERVICAL SPINE FINDINGS  Scattered atherosclerotic calcifications particularly the carotid bifurcations.  Visualized skullbase intact.  Vertebral body heights maintained.  Minimal scattered disc space narrowing greatest at C5-C6.  Minimal anterolisthesis at C4-C5 and C5-C6.  Mild scattered facet degenerative changes bilaterally.  No acute fracture, additional subluxation or bone destruction.  Lung apices clear.  IMPRESSION: Calcified RIGHT parasellar mass 2.5 x 2.3 cm in size, question calcified meningioma or less likely ICA aneurysm with calcified thrombus; followup MR imaging of the brain with and without contrast and MRA imaging of the head recommended.  No additional intracranial abnormalities identified.  Mild degenerative disc and facet disease changes of the cervical spine.  No acute cervical spine abnormalities.  Findings called to Alvina Chou on 08/11/2014 at 1602 hr.   Electronically Signed   By: Lavonia Dana M.D.   On: 08/11/2014 16:04   Ct Cervical Spine Wo Contrast  08/11/2014   CLINICAL DATA:  Walking out of bathroom and stubbed her toe, struck head on wall, posterior hematoma and laceration, fall, initial encounter  EXAM: CT HEAD WITHOUT CONTRAST  CT CERVICAL SPINE WITHOUT CONTRAST  TECHNIQUE: Multidetector CT imaging of the head and cervical spine was performed following the standard protocol without intravenous contrast. Multiplanar CT image reconstructions of the cervical spine were also generated.  COMPARISON:  None.  FINDINGS: CT HEAD FINDINGS  Normal  ventricular morphology.  No midline shift or mass effect.  Large calcified mass RIGHT parasellar, 2.5 x 2.3 cm.  Brain parenchyma otherwise normal in appearance.  No additional mass, intracranial hemorrhage, or evidence acute infarction.  No extra-axial fluid collections.  Calvaria intact.  No skullbase bony changes identified adjacent to the calcified mass.  Atherosclerotic calcifications of the carotid siphons bilaterally.  CT CERVICAL SPINE FINDINGS  Scattered atherosclerotic calcifications particularly the carotid bifurcations.  Visualized skullbase intact.  Vertebral body heights maintained.  Minimal scattered disc space narrowing greatest at C5-C6.  Minimal anterolisthesis at C4-C5 and C5-C6.  Mild scattered facet degenerative changes bilaterally.  No acute fracture, additional subluxation or bone destruction.  Lung apices clear.  IMPRESSION: Calcified RIGHT parasellar mass 2.5 x 2.3 cm in size, question calcified meningioma or less likely ICA aneurysm with calcified thrombus; followup MR imaging of the brain with and without contrast and MRA imaging of the head recommended.  No additional intracranial abnormalities identified.  Mild degenerative disc and facet disease changes of the cervical spine.  No acute cervical spine abnormalities.  Findings called to Alvina Chou on 08/11/2014 at 1602 hr.   Electronically Signed   By: Lavonia Dana M.D.   On: 08/11/2014 16:04   Mr Jodene Nam Head Wo Contrast  08/12/2014   CLINICAL DATA:  Followup abnormal head CT.  Recent fall.  EXAM: MRI HEAD WITHOUT CONTRAST  MRA HEAD WITHOUT CONTRAST  TECHNIQUE: Multiplanar, multiecho pulse sequences of the brain and surrounding structures were obtained without intravenous contrast. Angiographic images of the head were obtained using MRA technique without contrast.  COMPARISON:  None.  FINDINGS: MRI HEAD FINDINGS  Diffuse prominence of the CSF containing spaces is compatible with generalized age-related cerebral atrophy. Scattered  and confluent T2/FLAIR hyperintensity within the periventricular and deep white matter both cerebral hemispheres is most consistent with chronic small vessel ischemic changes, fairly mild for patient age. Small remote lacunar infarct present within the basal ganglia bilaterally.  No abnormal foci of restricted diffusion to suggest acute intracranial infarct identified. Gray-white matter differentiation maintained. Normal flow voids seen within the intracranial vasculature.  Well-circumscribed extra-axial tumor measuring 2.6 x 2.4 x 2.3 cm present within the right parasellar region along the right anterior clinoid process, most compatible with a meningioma. This lesion demonstrates fairly homogeneous post-contrast enhancement. Central hypodensity within this lesion on T2 weighted sequence most consistent with calcification, better evaluated on prior CT. No extension into the orbital apex or sellar region itself. The mass approximates and partially encases the supra clinoid right ICA.  An additional small plaque-like meningioma overlies the left cerebral convexity, measuring 1.3 x 0.5 x 1.1 cm (series 16, image 32). There is associated dural thickening. Mildly prominent pachymeningeal dural thickening with enhancement present about both cerebral hemispheres.  Craniocervical junction within normal  limits. Incidental note made of an empty sella. No acute abnormality seen about the orbits. Delete that  Mild mucoperiosteal thickening present within the right sphenoid sinus and ethmoidal air cells. Paranasal sinuses are otherwise clear. No significant mastoid effusion.  1.6 cm T1 hyperintense nonenhancing lesion within the left frontal calvarium noted, of doubtful clinical significance. And adjacent smaller 6 mm lesion with similar characteristics noted as well.  MRA HEAD FINDINGS  ANTERIOR CIRCULATION  The distal cervical segments of the internal carotid arteries are widely patent bilaterally. Petrous segments within  normal limits. There is severe high-grade stenosis of at least 70% within the proximal-mid aspects of the cavernous left ICA (series 3, image 81). Distally, mild multi focal atherosclerotic irregularity seen within the supra clinoid left ICA which is widely patent. The left M1 segment widely patent.  There is moderate to severe narrowing of the supra clinoid right ICA and just proximal to its branching into the right A1 and M1 segments due to compression by the adjacent right parasellar meningioma (series 3, image 104). The proximal right A1 segment is attenuated but patent. There is moderate narrowing of the proximal right M1 segment as well. Right M1 segment is well opacified distally.  Distal MCA branches well opacified bilaterally.  Anterior communicating artery and anterior cerebral arteries well opacified bilaterally.  POSTERIOR CIRCULATION:  Distal vertebral arteries well opacified bilaterally. Posterior inferior cerebral arteries are patent. Vertebrobasilar junction and basilar artery are widely patent. P1 and P2 segments patent bilaterally. Prominent right posterior communicating artery is present.  No aneurysm or vascular malformation.  IMPRESSION: MRI HEAD IMPRESSION:  1. No acute intracranial abnormality identified. 2. 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 3. Additional small meningioma overlying the left cerebral convexity as above. 4. Age-appropriate atrophy with mild chronic small vessel ischemic disease.  MRA HEAD IMPRESSION:  1. Severe atherosclerotic stenosis of the proximal -mid cavernous left ICA of at least 70%. 2. Moderate to severe stenosis of the supra clinoid right ICA at the level of the right parasellar meningioma, which is partially encased by the tumor. There is moderate narrowing of the proximal right M1 segment as well. A prominent right posterior communicating artery is present. 3. No aneurysm.   Electronically Signed   By: Jeannine Boga M.D.   On:  08/12/2014 02:10   Mr Jeri Cos JI Contrast  08/12/2014   CLINICAL DATA:  Followup abnormal head CT.  Recent fall.  EXAM: MRI HEAD WITHOUT CONTRAST  MRA HEAD WITHOUT CONTRAST  TECHNIQUE: Multiplanar, multiecho pulse sequences of the brain and surrounding structures were obtained without intravenous contrast. Angiographic images of the head were obtained using MRA technique without contrast.  COMPARISON:  None.  FINDINGS: MRI HEAD FINDINGS  Diffuse prominence of the CSF containing spaces is compatible with generalized age-related cerebral atrophy. Scattered and confluent T2/FLAIR hyperintensity within the periventricular and deep white matter both cerebral hemispheres is most consistent with chronic small vessel ischemic changes, fairly mild for patient age. Small remote lacunar infarct present within the basal ganglia bilaterally.  No abnormal foci of restricted diffusion to suggest acute intracranial infarct identified. Gray-white matter differentiation maintained. Normal flow voids seen within the intracranial vasculature.  Well-circumscribed extra-axial tumor measuring 2.6 x 2.4 x 2.3 cm present within the right parasellar region along the right anterior clinoid process, most compatible with a meningioma. This lesion demonstrates fairly homogeneous post-contrast enhancement. Central hypodensity within this lesion on T2 weighted sequence most consistent with calcification, better evaluated  on prior CT. No extension into the orbital apex or sellar region itself. The mass approximates and partially encases the supra clinoid right ICA.  An additional small plaque-like meningioma overlies the left cerebral convexity, measuring 1.3 x 0.5 x 1.1 cm (series 16, image 32). There is associated dural thickening. Mildly prominent pachymeningeal dural thickening with enhancement present about both cerebral hemispheres.  Craniocervical junction within normal limits. Incidental note made of an empty sella. No acute abnormality  seen about the orbits. Delete that  Mild mucoperiosteal thickening present within the right sphenoid sinus and ethmoidal air cells. Paranasal sinuses are otherwise clear. No significant mastoid effusion.  1.6 cm T1 hyperintense nonenhancing lesion within the left frontal calvarium noted, of doubtful clinical significance. And adjacent smaller 6 mm lesion with similar characteristics noted as well.  MRA HEAD FINDINGS  ANTERIOR CIRCULATION  The distal cervical segments of the internal carotid arteries are widely patent bilaterally. Petrous segments within normal limits. There is severe high-grade stenosis of at least 70% within the proximal-mid aspects of the cavernous left ICA (series 3, image 81). Distally, mild multi focal atherosclerotic irregularity seen within the supra clinoid left ICA which is widely patent. The left M1 segment widely patent.  There is moderate to severe narrowing of the supra clinoid right ICA and just proximal to its branching into the right A1 and M1 segments due to compression by the adjacent right parasellar meningioma (series 3, image 104). The proximal right A1 segment is attenuated but patent. There is moderate narrowing of the proximal right M1 segment as well. Right M1 segment is well opacified distally.  Distal MCA branches well opacified bilaterally.  Anterior communicating artery and anterior cerebral arteries well opacified bilaterally.  POSTERIOR CIRCULATION:  Distal vertebral arteries well opacified bilaterally. Posterior inferior cerebral arteries are patent. Vertebrobasilar junction and basilar artery are widely patent. P1 and P2 segments patent bilaterally. Prominent right posterior communicating artery is present.  No aneurysm or vascular malformation.  IMPRESSION: MRI HEAD IMPRESSION:  1. No acute intracranial abnormality identified. 2. 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 3. Additional small meningioma overlying the left cerebral convexity as  above. 4. Age-appropriate atrophy with mild chronic small vessel ischemic disease.  MRA HEAD IMPRESSION:  1. Severe atherosclerotic stenosis of the proximal -mid cavernous left ICA of at least 70%. 2. Moderate to severe stenosis of the supra clinoid right ICA at the level of the right parasellar meningioma, which is partially encased by the tumor. There is moderate narrowing of the proximal right M1 segment as well. A prominent right posterior communicating artery is present. 3. No aneurysm.   Electronically Signed   By: Jeannine Boga M.D.   On: 08/12/2014 02:10     EKG Interpretation None      MDM   Final diagnoses:  Fall  Follow up  Meningioma  Internal carotid artery stenosis, bilateral  Scalp laceration, initial encounter    3:50 PM CT head and cervical spine pending. Patient has a 3cm laceration to the occipital area of scalp with bleeding controlled at this time.   4:50 PM Patient's head CT shows likely calcified meningioma which should be further evaluated with MRI. Patient will have further imaging here. No acute changes seen on CT head or cervical spine.  Patient signed out to Antonietta Breach, PA-C pending MRI.   Alvina Chou, PA-C 08/13/14 0101

## 2014-08-11 NOTE — ED Notes (Signed)
Patient transported to CT 

## 2014-08-11 NOTE — ED Notes (Addendum)
Per EMS- Patient was walking out of the bathroom and stubbed her toe. Patient hit her had on the wall. Patient has a small hematoma and 1/1/2 inch laceration to the mid posterior head area. Patient also has a skin tear to the left elbow. No LOC. Patient does not take anticoagulants.

## 2014-08-11 NOTE — ED Provider Notes (Signed)
Medical screening examination/treatment/procedure(s) were conducted as a shared visit with non-physician practitioner(s) and myself.  I personally evaluated the patient during the encounter.   EKG Interpretation None      Pt is a 80 y.o. F with history of hypertension, hypothyroidism, gout who presents to the emergency department during head injury. She was walking out of her bathroom when her walker hit the edge of the wall and she struck her head on the wall. She has a small laceration to her posterior scalp, skin tear to her left elbow.  Otherwise hemodynamically stable and neuro intact.  CT head concerning for possible parasellar mass versus aneurysm.  Radiology recommended MRI brain and MRA brain.  Pt is c/o HA but is neuro intact.  No C spine injury.  Dispo per MRI results.  [place], DO 08/11/14 2323

## 2014-08-11 NOTE — ED Notes (Signed)
Patient transported to MRI 

## 2014-08-12 MED ORDER — GADOBENATE DIMEGLUMINE 529 MG/ML IV SOLN
14.0000 mL | Freq: Once | INTRAVENOUS | Status: AC | PRN
  Administered 2014-08-12: 14 mL via INTRAVENOUS

## 2014-08-12 NOTE — Discharge Instructions (Signed)
Meningioma Meningioma is a tumor that occurs in the thin tissue that covers the brain and spinal cord (meninges). Meningiomas are usually benign, which means they are not cancerous and do not spread to other areas. In rare cases, a meningioma may become cancerous (malignant). Older women have a higher risk of having meningiomas. However, men have a higher risk of having a meningioma that is malignant. RISK FACTORS People who have had radiation exposure in the past may have an increased risk of developing this type of tumor. People who have neurofibromatosis 2 may also have an increased risk of meningioma. Older women have a higher risk of meningiomas than men or children. However, men have a higher risk of meningiomas that are malignant. SIGNS AND SYMPTOMS Symptoms of meningioma usually begin very slowly. The symptoms may depend on the size and location of the tumor. Possible symptoms include:   Headaches.  Nausea and vomiting.  Vision changes.  Hearing changes.   Loss of the sense of smell.  Seizures.   Weakness or numbness on one side of the body or in an arm or leg.   Mood changes.   Problems with memory or thinking.  DIAGNOSIS  Brain tumors can usually be seen on brain imaging, such as CT scan or MRI. A sample of the tumor will need to be studied in a laboratory (biopsy) to confirm the diagnosis of meningioma. Information about the tumor cells also helps guide treatment. TREATMENT  Because meningioma is so slow growing, treatment is often delayed until symptoms affect daily activities. Regular monitoring is performed to track the tumor's growth.  There are several ways that meningioma is treated:  Surgery to remove as much of the tumor as possible.  High-energy rays (radiation therapy) to help shrink or kill the tumor.  Chemotherapy to shrink or kill the tumor. Because normal cells may also be killed, chemotherapy has many side effects.  Targeted therapy, using  substances that injure or kill cancer cells without affecting normal cells.  Steroid medicine to decrease brain swelling and improve symptoms. HOME CARE INSTRUCTIONS  Take all medicines as directed by your health care provider.  Go to all follow-up appointments. SEEK MEDICAL CARE IF:  Any symptoms come back.  You have diarrhea, throw up (vomit), or have abdominal pain.  You cannot eat or drink as much as you need.  You are more weak or tired than usual.   You are losing weight without trying. SEEK IMMEDIATE MEDICAL CARE IF:  Your diarrhea, vomiting, or abdominal pain does not go away.  You have new symptoms, such as vision problems or difficulty walking.   You have a seizure.   You have bleeding that does not stop.   You have trouble breathing.   You have a fever.  Document Released: 09/13/2013 Document Reviewed: 09/13/2013 [facility] Patient Information 2015 [place], [place]. This information is not intended to replace advice given to you by your health care provider. Make sure you discuss any questions you have with your health care provider. Carotid Artery Disease  The carotid arteries are arteries on both sides of the neck. They carry blood to the brain. Carotid artery disease is when the arteries get smaller (narrow) or get blocked. If these arteries get smaller or get blocked, you are more likely to have a stroke or warning stroke (transient ischemic attack).  HOME CARE  Take medicines as told by your doctor. Make sure you understand all your medicine instructions. Do not stop your medicines without talking to  your doctor first.  Follow your doctor's diet instructions. It is important to eat a healthy diet that includes plenty of:  Fresh fruits.  Vegetables.  Lean meats.  Avoid:  High-fat foods.  High-sodium foods.  Foods that are fried, overly processed, or have poor nutritional value.  Stay a healthy weight.  Stay active. Get at least 30 minutes  of activity every day.  Do not smoke.  Limit alcohol use to:  No more than 2 drinks a day for men.  No more than 1 drink a day for women who are not pregnant.  Do not use illegal drugs.  Keep all doctor visits as told. GET HELP RIGHT AWAY IF:   You have sudden weakness or loss of feeling (numbness) on one side of the body, such as the face, arm, or leg.  You have sudden confusion.  You have trouble speaking (aphasia) or understanding.  You have sudden trouble seeing out of one or both eyes.  You have sudden trouble walking.  You have dizziness or feel like you might pass out (faint).  You have a loss of balance or your movements are not steady (uncoordinated).  You have a sudden, severe headache with no known cause.  You have trouble swallowing (dysphagia). Call your local emergency services (911 in [place] Do notdrive yourself to the clinic or hospital.  Document Released: 08/25/2012 Document Revised: 05/11/2013 Document Reviewed: 03/09/2013 [facility] Patient Information 2015 [place], [place]. This information is not intended to replace advice given to you by your health care provider. Make sure you discuss any questions you have with your health care provider. Fall Prevention and Home Safety Falls cause injuries and can affect all age groups. It is possible to use preventive measures to significantly decrease the likelihood of falls. There are many simple measures which can make your home safer and prevent falls. OUTDOORS  Repair cracks and edges of walkways and driveways.  Remove high doorway thresholds.  Trim shrubbery on the main path into your home.  Have good outside lighting.  Clear walkways of tools, rocks, debris, and clutter.  Check that handrails are not broken and are securely fastened. Both sides of steps should have handrails.  Have leaves, snow, and ice cleared regularly.  Use sand or salt on walkways during winter months.  In the garage, clean up  grease or oil spills. BATHROOM  Install night lights.  Install grab bars by the toilet and in the tub and shower.  Use non-skid mats or decals in the tub or shower.  Place a plastic non-slip stool in the shower to sit on, if needed.  Keep floors dry and clean up all water on the floor immediately.  Remove soap buildup in the tub or shower on a regular basis.  Secure bath mats with non-slip, double-sided rug tape.  Remove throw rugs and tripping hazards from the floors. BEDROOMS  Install night lights.  Make sure a bedside light is easy to reach.  Do not use oversized bedding.  Keep a telephone by your bedside.  Have a firm chair with side arms to use for getting dressed.  Remove throw rugs and tripping hazards from the floor. KITCHEN  Keep handles on pots and pans turned toward the center of the stove. Use back burners when possible.  Clean up spills quickly and allow time for drying.  Avoid walking on wet floors.  Avoid hot utensils and knives.  Position shelves so they are not too high or low.  Place commonly  used objects within easy reach.  If necessary, use a sturdy step stool with a grab bar when reaching.  Keep electrical cables out of the way.  Do not use floor polish or wax that makes floors slippery. If you must use wax, use non-skid floor wax.  Remove throw rugs and tripping hazards from the floor. STAIRWAYS  Never leave objects on stairs.  Place handrails on both sides of stairways and use them. Fix any loose handrails. Make sure handrails on both sides of the stairways are as long as the stairs.  Check carpeting to make sure it is firmly attached along stairs. Make repairs to worn or loose carpet promptly.  Avoid placing throw rugs at the top or bottom of stairways, or properly secure the rug with carpet tape to prevent slippage. Get rid of throw rugs, if possible.  Have an electrician put in a light switch at the top and bottom of the  stairs. OTHER FALL PREVENTION TIPS  Wear low-heel or rubber-soled shoes that are supportive and fit well. Wear closed toe shoes.  When using a stepladder, make sure it is fully opened and both spreaders are firmly locked. Do not climb a closed stepladder.  Add color or contrast paint or tape to grab bars and handrails in your home. Place contrasting color strips on first and last steps.  Learn and use mobility aids as needed. Install an electrical emergency response system.  Turn on lights to avoid dark areas. Replace light bulbs that burn out immediately. Get light switches that glow.  Arrange furniture to create clear pathways. Keep furniture in the same place.  Firmly attach carpet with non-skid or double-sided tape.  Eliminate uneven floor surfaces.  Select a carpet pattern that does not visually hide the edge of steps.  Be aware of all pets. OTHER HOME SAFETY TIPS  Set the water temperature for 120 F (48.8 C).  Keep emergency numbers on or near the telephone.  Keep smoke detectors on every level of the home and near sleeping areas. Document Released: 08/29/2002 Document Revised: 03/09/2012 Document Reviewed: 11/28/2011 [facility] Patient Information 2015 [place], [place]. This information is not intended to replace advice given to you by your health care provider. Make sure you discuss any questions you have with your health care provider.

## 2014-08-12 NOTE — ED Provider Notes (Signed)
0250 - Patient care assumed from [facility], PA-C at shift change. Patient with MRI pending at shift change to further evaluate calcified right parasellar mass. 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 Patient is found to have a 70% stenosis of her left ICA as well as moderate to severe stenosis of the right ICA. Findings reviewed with patient and daughter at bedside. Have also discussed the nonemergent nature of the findings as I believe the patient is able to follow-up further as an outpatient. Have advised that she first follow-up with her primary care provider, but will provide referrals to both vascular surgery and neurosurgery should patient desire further workup and evaluation of these findings. Patient has no complaints at this time. Laceration approximated with hair opposition; laceration appears superficial in nature. There is no active bleeding or skull exposure. Return precautions discussed and provided. Patient and daughter are agreeable to plan with no unaddressed concerns. Patient discharged in good condition; VSS.  LACERATION REPAIR Performed by: Antonietta Breach Authorized by: Antonietta Breach Consent: Verbal consent obtained. Risks and benefits: risks, benefits and alternatives were discussed Consent given by: patient Patient identity confirmed: provided demographic data Prepped and Draped in normal sterile fashion Wound explored  Laceration Location: posterior scalp  Laceration Length: 1.5cm  No Foreign Bodies seen or palpated  Anesthesia: none  Local anesthetic: none  Anesthetic total: n/a  Amount of cleaning: standard  Skin closure: hair  Number of sutures: 1  Technique: hair apposition  Patient tolerance: Patient tolerated the procedure well with no immediate complications.  Results for orders placed or performed during the hospital encounter of 08/11/14  CBC  with Differential  Result Value Ref Range   WBC 7.4 4.0 - 10.5 K/uL   RBC 3.75 (L) 3.87 - 5.11 MIL/uL   Hemoglobin 11.4 (L) 12.0 - 15.0 g/dL   HCT 35.2 (L) 36.0 - 46.0 %   MCV 93.9 78.0 - 100.0 fL   MCH 30.4 26.0 - 34.0 pg   MCHC 32.4 30.0 - 36.0 g/dL   RDW 13.4 11.5 - 15.5 %   Platelets 315 150 - 400 K/uL   Neutrophils Relative % 75 43 - 77 %   Neutro Abs 5.6 1.7 - 7.7 K/uL   Lymphocytes Relative 12 12 - 46 %   Lymphs Abs 0.9 0.7 - 4.0 K/uL   Monocytes Relative 11 3 - 12 %   Monocytes Absolute 0.8 0.1 - 1.0 K/uL   Eosinophils Relative 2 0 - 5 %   Eosinophils Absolute 0.2 0.0 - 0.7 K/uL   Basophils Relative 0 0 - 1 %   Basophils Absolute 0.0 0.0 - 0.1 K/uL  Basic metabolic panel  Result Value Ref Range   Sodium 133 (L) 137 - 147 mEq/L   Potassium 3.3 (L) 3.7 - 5.3 mEq/L   Chloride 95 (L) 96 - 112 mEq/L   CO2 28 19 - 32 mEq/L   Glucose, Bld 128 (H) 70 - 99 mg/dL   BUN 14 6 - 23 mg/dL   Creatinine, Ser 0.71 0.50 - 1.10 mg/dL   Calcium 9.6 8.4 - 10.5 mg/dL   GFR calc non Af Amer 74 (L) >90 mL/min   GFR calc Af Amer 86 (L) >90 mL/min   Anion gap 10 5 - 15   Ct Head Wo Contrast  08/11/2014   CLINICAL DATA:  Walking out of bathroom and stubbed her toe,  struck head on wall, posterior hematoma and laceration, fall, initial encounter  EXAM: CT HEAD WITHOUT CONTRAST  CT CERVICAL SPINE WITHOUT CONTRAST  TECHNIQUE: Multidetector CT imaging of the head and cervical spine was performed following the standard protocol without intravenous contrast. Multiplanar CT image reconstructions of the cervical spine were also generated.  COMPARISON:  None.  FINDINGS: CT HEAD FINDINGS  Normal ventricular morphology.  No midline shift or mass effect.  Large calcified mass RIGHT parasellar, 2.5 x 2.3 cm.  Brain parenchyma otherwise normal in appearance.  No additional mass, intracranial hemorrhage, or evidence acute infarction.  No extra-axial fluid collections.  Calvaria intact.  No skullbase bony changes  identified adjacent to the calcified mass.  Atherosclerotic calcifications of the carotid siphons bilaterally.  CT CERVICAL SPINE FINDINGS  Scattered atherosclerotic calcifications particularly the carotid bifurcations.  Visualized skullbase intact.  Vertebral body heights maintained.  Minimal scattered disc space narrowing greatest at C5-C6.  Minimal anterolisthesis at C4-C5 and C5-C6.  Mild scattered facet degenerative changes bilaterally.  No acute fracture, additional subluxation or bone destruction.  Lung apices clear.  IMPRESSION: Calcified RIGHT parasellar mass 2.5 x 2.3 cm in size, question calcified meningioma or less likely ICA aneurysm with calcified thrombus; followup MR imaging of the brain with and without contrast and MRA imaging of the head recommended.  No additional intracranial abnormalities identified.  Mild degenerative disc and facet disease changes of the cervical spine.  No acute cervical spine abnormalities.  Findings called to Alvina Chou on 08/11/2014 at 1602 hr.   Electronically Signed   By: Lavonia Dana M.D.   On: 08/11/2014 16:04   Ct Cervical Spine Wo Contrast  08/11/2014   CLINICAL DATA:  Walking out of bathroom and stubbed her toe, struck head on wall, posterior hematoma and laceration, fall, initial encounter  EXAM: CT HEAD WITHOUT CONTRAST  CT CERVICAL SPINE WITHOUT CONTRAST  TECHNIQUE: Multidetector CT imaging of the head and cervical spine was performed following the standard protocol without intravenous contrast. Multiplanar CT image reconstructions of the cervical spine were also generated.  COMPARISON:  None.  FINDINGS: CT HEAD FINDINGS  Normal ventricular morphology.  No midline shift or mass effect.  Large calcified mass RIGHT parasellar, 2.5 x 2.3 cm.  Brain parenchyma otherwise normal in appearance.  No additional mass, intracranial hemorrhage, or evidence acute infarction.  No extra-axial fluid collections.  Calvaria intact.  No skullbase bony changes identified  adjacent to the calcified mass.  Atherosclerotic calcifications of the carotid siphons bilaterally.  CT CERVICAL SPINE FINDINGS  Scattered atherosclerotic calcifications particularly the carotid bifurcations.  Visualized skullbase intact.  Vertebral body heights maintained.  Minimal scattered disc space narrowing greatest at C5-C6.  Minimal anterolisthesis at C4-C5 and C5-C6.  Mild scattered facet degenerative changes bilaterally.  No acute fracture, additional subluxation or bone destruction.  Lung apices clear.  IMPRESSION: Calcified RIGHT parasellar mass 2.5 x 2.3 cm in size, question calcified meningioma or less likely ICA aneurysm with calcified thrombus; followup MR imaging of the brain with and without contrast and MRA imaging of the head recommended.  No additional intracranial abnormalities identified.  Mild degenerative disc and facet disease changes of the cervical spine.  No acute cervical spine abnormalities.  Findings called to Alvina Chou on 08/11/2014 at 1602 hr.   Electronically Signed   By: Lavonia Dana M.D.   On: 08/11/2014 16:04   Mr [name] Head Wo Contrast  08/12/2014   CLINICAL DATA:  Followup abnormal head CT.  Recent fall.  EXAM: MRI HEAD WITHOUT CONTRAST  MRA HEAD WITHOUT CONTRAST  TECHNIQUE: Multiplanar, multiecho pulse sequences of the brain and surrounding structures were obtained without intravenous contrast. Angiographic images of the head were obtained using MRA technique without contrast.  COMPARISON:  None.  FINDINGS: MRI HEAD FINDINGS  Diffuse prominence of the CSF containing spaces is compatible with generalized age-related cerebral atrophy. Scattered and confluent T2/FLAIR hyperintensity within the periventricular and deep white matter both cerebral hemispheres is most consistent with chronic small vessel ischemic changes, fairly mild for patient age. Small remote lacunar infarct present within the basal ganglia bilaterally.  No abnormal foci of restricted diffusion to  suggest acute intracranial infarct identified. Gray-white matter differentiation maintained. Normal flow voids seen within the intracranial vasculature.  Well-circumscribed extra-axial tumor measuring 2.6 x 2.4 x 2.3 cm present within the right parasellar region along the right anterior clinoid process, most compatible with a meningioma. This lesion demonstrates fairly homogeneous post-contrast enhancement. Central hypodensity within this lesion on T2 weighted sequence most consistent with calcification, better evaluated on prior CT. No extension into the orbital apex or sellar region itself. The mass approximates and partially encases the supra clinoid right ICA.  An additional small plaque-like meningioma overlies the left cerebral convexity, measuring 1.3 x 0.5 x 1.1 cm (series 16, image 32). There is associated dural thickening. Mildly prominent pachymeningeal dural thickening with enhancement present about both cerebral hemispheres.  Craniocervical junction within normal limits. Incidental note made of an empty sella. No acute abnormality seen about the orbits. Delete that  Mild mucoperiosteal thickening present within the right sphenoid sinus and ethmoidal air cells. Paranasal sinuses are otherwise clear. No significant mastoid effusion.  1.6 cm T1 hyperintense nonenhancing lesion within the left frontal calvarium noted, of doubtful clinical significance. And adjacent smaller 6 mm lesion with similar characteristics noted as well.  MRA HEAD FINDINGS  ANTERIOR CIRCULATION  The distal cervical segments of the internal carotid arteries are widely patent bilaterally. Petrous segments within normal limits. There is severe high-grade stenosis of at least 70% within the proximal-mid aspects of the cavernous left ICA (series 3, image 81). Distally, mild multi focal atherosclerotic irregularity seen within the supra clinoid left ICA which is widely patent. The left M1 segment widely patent.  There is moderate to severe  narrowing of the supra clinoid right ICA and just proximal to its branching into the right A1 and M1 segments due to compression by the adjacent right parasellar meningioma (series 3, image 104). The proximal right A1 segment is attenuated but patent. There is moderate narrowing of the proximal right M1 segment as well. Right M1 segment is well opacified distally.  Distal MCA branches well opacified bilaterally.  Anterior communicating artery and anterior cerebral arteries well opacified bilaterally.  POSTERIOR CIRCULATION:  Distal vertebral arteries well opacified bilaterally. Posterior inferior cerebral arteries are patent. Vertebrobasilar junction and basilar artery are widely patent. P1 and P2 segments patent bilaterally. Prominent right posterior communicating artery is present.  No aneurysm or vascular malformation.  IMPRESSION: MRI HEAD IMPRESSION:  1. No acute intracranial abnormality identified. 2. 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 3. Additional small meningioma overlying the left cerebral convexity as above. 4. Age-appropriate atrophy with mild chronic small vessel ischemic disease.  MRA HEAD IMPRESSION:  1. Severe atherosclerotic stenosis of the proximal -mid cavernous left ICA of at least 70%. 2. Moderate to severe stenosis of the supra clinoid right ICA at the level of the right parasellar meningioma,  which is partially encased by the tumor. There is moderate narrowing of the proximal right M1 segment as well. A prominent right posterior communicating artery is present. 3. No aneurysm.   Electronically Signed   By: Jeannine Boga M.D.   On: 08/12/2014 02:10   Mr [name] ER Contrast  08/12/2014   CLINICAL DATA:  Followup abnormal head CT.  Recent fall.  EXAM: MRI HEAD WITHOUT CONTRAST  MRA HEAD WITHOUT CONTRAST  TECHNIQUE: Multiplanar, multiecho pulse sequences of the brain and surrounding structures were obtained without intravenous contrast. Angiographic images  of the head were obtained using MRA technique without contrast.  COMPARISON:  None.  FINDINGS: MRI HEAD FINDINGS  Diffuse prominence of the CSF containing spaces is compatible with generalized age-related cerebral atrophy. Scattered and confluent T2/FLAIR hyperintensity within the periventricular and deep white matter both cerebral hemispheres is most consistent with chronic small vessel ischemic changes, fairly mild for patient age. Small remote lacunar infarct present within the basal ganglia bilaterally.  No abnormal foci of restricted diffusion to suggest acute intracranial infarct identified. Gray-white matter differentiation maintained. Normal flow voids seen within the intracranial vasculature.  Well-circumscribed extra-axial tumor measuring 2.6 x 2.4 x 2.3 cm present within the right parasellar region along the right anterior clinoid process, most compatible with a meningioma. This lesion demonstrates fairly homogeneous post-contrast enhancement. Central hypodensity within this lesion on T2 weighted sequence most consistent with calcification, better evaluated on prior CT. No extension into the orbital apex or sellar region itself. The mass approximates and partially encases the supra clinoid right ICA.  An additional small plaque-like meningioma overlies the left cerebral convexity, measuring 1.3 x 0.5 x 1.1 cm (series 16, image 32). There is associated dural thickening. Mildly prominent pachymeningeal dural thickening with enhancement present about both cerebral hemispheres.  Craniocervical junction within normal limits. Incidental note made of an empty sella. No acute abnormality seen about the orbits. Delete that  Mild mucoperiosteal thickening present within the right sphenoid sinus and ethmoidal air cells. Paranasal sinuses are otherwise clear. No significant mastoid effusion.  1.6 cm T1 hyperintense nonenhancing lesion within the left frontal calvarium noted, of doubtful clinical significance. And  adjacent smaller 6 mm lesion with similar characteristics noted as well.  MRA HEAD FINDINGS  ANTERIOR CIRCULATION  The distal cervical segments of the internal carotid arteries are widely patent bilaterally. Petrous segments within normal limits. There is severe high-grade stenosis of at least 70% within the proximal-mid aspects of the cavernous left ICA (series 3, image 81). Distally, mild multi focal atherosclerotic irregularity seen within the supra clinoid left ICA which is widely patent. The left M1 segment widely patent.  There is moderate to severe narrowing of the supra clinoid right ICA and just proximal to its branching into the right A1 and M1 segments due to compression by the adjacent right parasellar meningioma (series 3, image 104). The proximal right A1 segment is attenuated but patent. There is moderate narrowing of the proximal right M1 segment as well. Right M1 segment is well opacified distally.  Distal MCA branches well opacified bilaterally.  Anterior communicating artery and anterior cerebral arteries well opacified bilaterally.  POSTERIOR CIRCULATION:  Distal vertebral arteries well opacified bilaterally. Posterior inferior cerebral arteries are patent. Vertebrobasilar junction and basilar artery are widely patent. P1 and P2 segments patent bilaterally. Prominent right posterior communicating artery is present.  No aneurysm or vascular malformation.  IMPRESSION: MRI HEAD IMPRESSION:  1. No acute intracranial abnormality identified. 2. 2.6 x 2.4 x  2.3 cm right parasellar meningioma as detailed above. 3. Additional small meningioma overlying the left cerebral convexity as above. 4. Age-appropriate atrophy with mild chronic small vessel ischemic disease.  MRA HEAD IMPRESSION:  1. Severe atherosclerotic stenosis of the proximal -mid cavernous left ICA of at least 70%. 2. Moderate to severe stenosis of the supra clinoid right ICA at the level of the right parasellar meningioma, which is partially  encased by the tumor. There is moderate narrowing of the proximal right M1 segment as well. A prominent right posterior communicating artery is present. 3. No aneurysm.   Electronically Signed   By: Jeannine Boga M.D.   On: 08/12/2014 02:10     Antonietta Breach, PA-C 08/12/14 2549

## 2016-02-26 ENCOUNTER — Other Ambulatory Visit: Payer: Self-pay | Admitting: Family Medicine

## 2016-02-26 DIAGNOSIS — R7989 Other specified abnormal findings of blood chemistry: Secondary | ICD-10-CM

## 2016-02-26 DIAGNOSIS — R1033 Periumbilical pain: Secondary | ICD-10-CM

## 2016-02-26 DIAGNOSIS — R634 Abnormal weight loss: Secondary | ICD-10-CM

## 2016-02-26 DIAGNOSIS — R945 Abnormal results of liver function studies: Secondary | ICD-10-CM

## 2016-03-05 ENCOUNTER — Ambulatory Visit
Admission: RE | Admit: 2016-03-05 | Discharge: 2016-03-05 | Disposition: A | Discharge status: Home / Self Care | Payer: Medicare Other | Source: Ambulatory Visit | Attending: Family Medicine | Admitting: Family Medicine

## 2016-03-05 DIAGNOSIS — R1033 Periumbilical pain: Secondary | ICD-10-CM

## 2016-03-05 DIAGNOSIS — R945 Abnormal results of liver function studies: Secondary | ICD-10-CM

## 2016-03-05 DIAGNOSIS — R634 Abnormal weight loss: Secondary | ICD-10-CM

## 2016-03-05 DIAGNOSIS — R7989 Other specified abnormal findings of blood chemistry: Secondary | ICD-10-CM

## 2016-03-05 MED ORDER — IOPAMIDOL (ISOVUE-300) INJECTION 61%
100.0000 mL | Freq: Once | INTRAVENOUS | Status: AC | PRN
  Administered 2016-03-05: 100 mL via INTRAVENOUS

## 2017-09-18 ENCOUNTER — Encounter (HOSPITAL_COMMUNITY): Payer: Self-pay | Admitting: Emergency Medicine

## 2017-09-18 ENCOUNTER — Emergency Department (HOSPITAL_COMMUNITY): Payer: Medicare Other

## 2017-09-18 ENCOUNTER — Other Ambulatory Visit: Payer: Self-pay

## 2017-09-18 ENCOUNTER — Inpatient Hospital Stay (HOSPITAL_COMMUNITY)
Admission: EM | Admit: 2017-09-18 | Discharge: 2017-10-01 | DRG: 064 | Disposition: A | Discharge status: Hospice | Payer: Medicare Other | Attending: Internal Medicine | Admitting: Internal Medicine

## 2017-09-18 DIAGNOSIS — G9341 Metabolic encephalopathy: Secondary | ICD-10-CM | POA: Diagnosis present

## 2017-09-18 DIAGNOSIS — I110 Hypertensive heart disease with heart failure: Secondary | ICD-10-CM | POA: Diagnosis present

## 2017-09-18 DIAGNOSIS — I63411 Cerebral infarction due to embolism of right middle cerebral artery: Principal | ICD-10-CM | POA: Diagnosis present

## 2017-09-18 DIAGNOSIS — R1312 Dysphagia, oropharyngeal phase: Secondary | ICD-10-CM | POA: Diagnosis present

## 2017-09-18 DIAGNOSIS — E785 Hyperlipidemia, unspecified: Secondary | ICD-10-CM

## 2017-09-18 DIAGNOSIS — Z96653 Presence of artificial knee joint, bilateral: Secondary | ICD-10-CM | POA: Diagnosis present

## 2017-09-18 DIAGNOSIS — I6522 Occlusion and stenosis of left carotid artery: Secondary | ICD-10-CM

## 2017-09-18 DIAGNOSIS — I5033 Acute on chronic diastolic (congestive) heart failure: Secondary | ICD-10-CM | POA: Diagnosis present

## 2017-09-18 DIAGNOSIS — K5909 Other constipation: Secondary | ICD-10-CM | POA: Diagnosis present

## 2017-09-18 DIAGNOSIS — G459 Transient cerebral ischemic attack, unspecified: Secondary | ICD-10-CM | POA: Diagnosis not present

## 2017-09-18 DIAGNOSIS — Z66 Do not resuscitate: Secondary | ICD-10-CM | POA: Diagnosis present

## 2017-09-18 DIAGNOSIS — Y92230 Patient room in hospital as the place of occurrence of the external cause: Secondary | ICD-10-CM | POA: Diagnosis not present

## 2017-09-18 DIAGNOSIS — Z6824 Body mass index (BMI) 24.0-24.9, adult: Secondary | ICD-10-CM

## 2017-09-18 DIAGNOSIS — R531 Weakness: Secondary | ICD-10-CM | POA: Diagnosis present

## 2017-09-18 DIAGNOSIS — R54 Age-related physical debility: Secondary | ICD-10-CM | POA: Diagnosis present

## 2017-09-18 DIAGNOSIS — I63511 Cerebral infarction due to unspecified occlusion or stenosis of right middle cerebral artery: Secondary | ICD-10-CM

## 2017-09-18 DIAGNOSIS — T8089XA Other complications following infusion, transfusion and therapeutic injection, initial encounter: Secondary | ICD-10-CM | POA: Diagnosis not present

## 2017-09-18 DIAGNOSIS — E039 Hypothyroidism, unspecified: Secondary | ICD-10-CM | POA: Diagnosis present

## 2017-09-18 DIAGNOSIS — Y848 Other medical procedures as the cause of abnormal reaction of the patient, or of later complication, without mention of misadventure at the time of the procedure: Secondary | ICD-10-CM | POA: Diagnosis not present

## 2017-09-18 DIAGNOSIS — I6523 Occlusion and stenosis of bilateral carotid arteries: Secondary | ICD-10-CM | POA: Diagnosis present

## 2017-09-18 DIAGNOSIS — E119 Type 2 diabetes mellitus without complications: Secondary | ICD-10-CM | POA: Diagnosis present

## 2017-09-18 DIAGNOSIS — K920 Hematemesis: Secondary | ICD-10-CM | POA: Diagnosis present

## 2017-09-18 DIAGNOSIS — M79603 Pain in arm, unspecified: Secondary | ICD-10-CM

## 2017-09-18 DIAGNOSIS — I4510 Unspecified right bundle-branch block: Secondary | ICD-10-CM | POA: Diagnosis present

## 2017-09-18 DIAGNOSIS — Z515 Encounter for palliative care: Secondary | ICD-10-CM | POA: Diagnosis present

## 2017-09-18 DIAGNOSIS — Z8249 Family history of ischemic heart disease and other diseases of the circulatory system: Secondary | ICD-10-CM

## 2017-09-18 DIAGNOSIS — D509 Iron deficiency anemia, unspecified: Secondary | ICD-10-CM | POA: Diagnosis present

## 2017-09-18 DIAGNOSIS — I639 Cerebral infarction, unspecified: Secondary | ICD-10-CM | POA: Diagnosis present

## 2017-09-18 DIAGNOSIS — F329 Major depressive disorder, single episode, unspecified: Secondary | ICD-10-CM | POA: Diagnosis present

## 2017-09-18 DIAGNOSIS — I679 Cerebrovascular disease, unspecified: Secondary | ICD-10-CM

## 2017-09-18 DIAGNOSIS — R001 Bradycardia, unspecified: Secondary | ICD-10-CM | POA: Diagnosis present

## 2017-09-18 DIAGNOSIS — K922 Gastrointestinal hemorrhage, unspecified: Secondary | ICD-10-CM

## 2017-09-18 DIAGNOSIS — I672 Cerebral atherosclerosis: Secondary | ICD-10-CM | POA: Diagnosis present

## 2017-09-18 DIAGNOSIS — R29703 NIHSS score 3: Secondary | ICD-10-CM | POA: Diagnosis present

## 2017-09-18 DIAGNOSIS — I6521 Occlusion and stenosis of right carotid artery: Secondary | ICD-10-CM

## 2017-09-18 DIAGNOSIS — Z79890 Hormone replacement therapy (postmenopausal): Secondary | ICD-10-CM

## 2017-09-18 DIAGNOSIS — D62 Acute posthemorrhagic anemia: Secondary | ICD-10-CM | POA: Diagnosis present

## 2017-09-18 DIAGNOSIS — J189 Pneumonia, unspecified organism: Secondary | ICD-10-CM

## 2017-09-18 DIAGNOSIS — R0602 Shortness of breath: Secondary | ICD-10-CM

## 2017-09-18 DIAGNOSIS — D329 Benign neoplasm of meninges, unspecified: Secondary | ICD-10-CM

## 2017-09-18 DIAGNOSIS — Z7982 Long term (current) use of aspirin: Secondary | ICD-10-CM

## 2017-09-18 DIAGNOSIS — I10 Essential (primary) hypertension: Secondary | ICD-10-CM | POA: Diagnosis present

## 2017-09-18 LAB — COMPREHENSIVE METABOLIC PANEL
ALT: 17 U/L (ref 14–54)
AST: 23 U/L (ref 15–41)
Albumin: 3.7 g/dL (ref 3.5–5.0)
Alkaline Phosphatase: 145 U/L — ABNORMAL HIGH (ref 38–126)
Anion gap: 8 (ref 5–15)
BUN: 16 mg/dL (ref 6–20)
CHLORIDE: 103 mmol/L (ref 101–111)
CO2: 29 mmol/L (ref 22–32)
CREATININE: 0.92 mg/dL (ref 0.44–1.00)
Calcium: 9.6 mg/dL (ref 8.9–10.3)
GFR, EST NON AFRICAN AMERICAN: 52 mL/min — AB (ref >60)
Glucose, Bld: 96 mg/dL (ref 65–99)
POTASSIUM: 4.2 mmol/L (ref 3.5–5.1)
SODIUM: 140 mmol/L (ref 135–145)
Total Bilirubin: 0.8 mg/dL (ref 0.3–1.2)
Total Protein: 6.7 g/dL (ref 6.5–8.1)

## 2017-09-18 LAB — URINALYSIS, ROUTINE W REFLEX MICROSCOPIC
BILIRUBIN URINE: NEGATIVE
Glucose, UA: NEGATIVE mg/dL
HGB URINE DIPSTICK: NEGATIVE
Ketones, ur: NEGATIVE mg/dL
Leukocytes, UA: NEGATIVE
Nitrite: NEGATIVE
PH: 7 (ref 5.0–8.0)
Protein, ur: NEGATIVE mg/dL
SPECIFIC GRAVITY, URINE: 1.006 (ref 1.005–1.030)

## 2017-09-18 LAB — DIFFERENTIAL
BASOS ABS: 0 10*3/uL (ref 0.0–0.1)
BASOS PCT: 0 %
EOS ABS: 0.2 10*3/uL (ref 0.0–0.7)
Eosinophils Relative: 4 %
Lymphocytes Relative: 19 %
Lymphs Abs: 1.3 10*3/uL (ref 0.7–4.0)
MONO ABS: 0.6 10*3/uL (ref 0.1–1.0)
MONOS PCT: 9 %
NEUTROS ABS: 4.4 10*3/uL (ref 1.7–7.7)
Neutrophils Relative %: 68 %

## 2017-09-18 LAB — I-STAT TROPONIN, ED: TROPONIN I, POC: 0.01 ng/mL (ref 0.00–0.08)

## 2017-09-18 LAB — RAPID URINE DRUG SCREEN, HOSP PERFORMED
AMPHETAMINES: NOT DETECTED
BENZODIAZEPINES: NOT DETECTED
Barbiturates: NOT DETECTED
COCAINE: NOT DETECTED
OPIATES: NOT DETECTED
Tetrahydrocannabinol: NOT DETECTED

## 2017-09-18 LAB — PROTIME-INR
INR: 1.08
PROTHROMBIN TIME: 13.9 s (ref 11.4–15.2)

## 2017-09-18 LAB — I-STAT CHEM 8, ED
BUN: 19 mg/dL (ref 6–20)
CHLORIDE: 103 mmol/L (ref 101–111)
Calcium, Ion: 1.18 mmol/L (ref 1.15–1.40)
Creatinine, Ser: 1 mg/dL (ref 0.44–1.00)
Glucose, Bld: 92 mg/dL (ref 65–99)
HCT: 37 % (ref 36.0–46.0)
HEMOGLOBIN: 12.6 g/dL (ref 12.0–15.0)
POTASSIUM: 4.3 mmol/L (ref 3.5–5.1)
SODIUM: 141 mmol/L (ref 135–145)
TCO2: 28 mmol/L (ref 22–32)

## 2017-09-18 LAB — CBC
HCT: 39.3 % (ref 36.0–46.0)
Hemoglobin: 12.7 g/dL (ref 12.0–15.0)
MCH: 31.6 pg (ref 26.0–34.0)
MCHC: 32.3 g/dL (ref 30.0–36.0)
MCV: 97.8 fL (ref 78.0–100.0)
PLATELETS: 254 10*3/uL (ref 150–400)
RBC: 4.02 MIL/uL (ref 3.87–5.11)
RDW: 13.5 % (ref 11.5–15.5)
WBC: 6.6 10*3/uL (ref 4.0–10.5)

## 2017-09-18 LAB — ETHANOL

## 2017-09-18 LAB — APTT: APTT: 35 s (ref 24–36)

## 2017-09-18 NOTE — Progress Notes (Signed)
Arrived from Ed. Alert and oriented. Denies any pain. Dtr at bedside. Call light within reach.

## 2017-09-18 NOTE — ED Provider Notes (Signed)
[place] EMERGENCY DEPARTMENT Provider Note   CSN: [id number] Arrival date & time: 09/18/17  1752     History   Chief Complaint Chief Complaint  Patient presents with  . stroke like symptoms    HPI [name] is a 83 y.o. female.  HPI Patient was at home.  Patient's daughter states that she went the other room came back again and she was slumped over the side and not moving her left side.  Difficulty speaking.  Left-sided facial droop.  By the time EMS had gotten there she was speaking and moving her left side.  No headache.  She is back to baseline now.  Is been doing well the last couple days.  Has a known history of a meningioma. Past Medical History:  Diagnosis Date  . Anemia   . Arthritis   . History of umbilical hernia repair   . Hypertension     Patient Active Problem List   Diagnosis Date Noted  . Abdominal wall hernia-repair with physiomesh 12/05/2011    Past Surgical History:  Procedure Laterality Date  . TOTAL KNEE ARTHROPLASTY     right  . TOTAL KNEE ARTHROPLASTY     left  . UMBILICAL HERNIA REPAIR      OB History    No data available       Home Medications    Prior to Admission medications   Medication Sig Start Date End Date Taking? Authorizing Provider  ALPRAZolam Duanne Moron) 0.5 MG tablet Take 0.5 mg by mouth at bedtime as needed for anxiety (anxiety).     [provider]  amLODipine (NORVASC) 2.5 MG tablet Take 2.5 mg by mouth daily.    [provider]  aspirin 81 MG tablet Take 81 mg by mouth daily.    [provider]  buPROPion (ZYBAN) 150 MG 12 hr tablet Take 150 mg by mouth 2 (two) times daily.    [provider]  carvedilol (COREG) 12.5 MG tablet Take 12.5 mg by mouth 2 (two) times daily with a meal.    [provider]  doxycycline (VIBRA-TABS) 100 MG tablet Take 100 mg by mouth 2 (two) times daily.    [provider]  ferrous sulfate 325 (65 FE) MG tablet Take  325 mg by mouth 2 (two) times daily with a meal.     [provider]  FLUoxetine (PROZAC) 40 MG capsule Take 40 mg by mouth daily.    [provider]  furosemide (LASIX) 40 MG tablet Take 40 mg by mouth daily.    [provider]  HYDROcodone-homatropine (HYCODAN) 5-1.5 MG/5ML syrup Take 5 mLs by mouth at bedtime.    [provider]  levothyroxine (SYNTHROID, LEVOTHROID) 50 MCG tablet Take 50 mcg by mouth daily before breakfast.    [provider]  lisinopril (PRINIVIL,ZESTRIL) 40 MG tablet Take 40 mg by mouth daily.    [provider]  pantoprazole (PROTONIX) 20 MG tablet Take 20 mg by mouth daily.    [provider]    Family History Family History  Problem Relation Age of Onset  . Heart disease Father     Social History Social History   Tobacco Use  . Smoking status: Never Smoker  Substance Use Topics  . Alcohol use: No  . Drug use: No     Allergies   Patient has no known allergies.   Review of Systems Review of Systems  Constitutional: Negative for appetite change.  HENT: Negative  for congestion.   Respiratory: Negative for chest tightness and shortness of breath.   Cardiovascular: Negative for chest pain.  Gastrointestinal: Negative for abdominal pain.  Genitourinary: Negative for flank pain.  Musculoskeletal: Negative for back pain.  Neurological: Positive for speech difficulty and weakness.  Hematological: Negative for adenopathy.  Psychiatric/Behavioral: Negative for confusion.     Physical Exam Updated Vital Signs BP (!) 174/68   Pulse (!) 56   Temp 97.8 F (36.6 C) (Oral)   Resp (!) 21   Ht 4\' 11"  (1.499 m)   Wt 55.3 kg (122 lb)   SpO2 100%   BMI 24.64 kg/m   Physical Exam  Constitutional: She is oriented to person, place, and time. She appears well-developed.  HENT:  Head: Atraumatic.  Eyes: EOM are normal.  Neck: Neck supple.  Cardiovascular: Normal rate.  Pulmonary/Chest: Effort  normal.  Abdominal: Soft. There is no tenderness.  Musculoskeletal: Normal range of motion.  Neurological: She is alert and oriented to person, place, and time.  Face is symmetric.  External ocular movements intact.  Good grip strength bilaterally.  Equal smile.  Moving both lower extremities.  Skin: Skin is warm. Capillary refill takes less than 2 seconds.     ED Treatments / Results  Labs (all labs ordered are listed, but only abnormal results are displayed) Labs Reviewed  PROTIME-INR  APTT  CBC  DIFFERENTIAL  ETHANOL  COMPREHENSIVE METABOLIC PANEL  RAPID URINE DRUG SCREEN, HOSP PERFORMED  URINALYSIS, ROUTINE W REFLEX MICROSCOPIC  I-STAT CHEM 8, ED  I-STAT TROPONIN, ED    EKG  EKG Interpretation  Date/Time:  Friday September 18 2017 18:11:26 EST Ventricular Rate:  51 PR Interval:    QRS Duration: 142 QT Interval:  519 QTC Calculation: 478 R Axis:   113 Text Interpretation:  Sinus rhythm Right bundle branch block Confirmed by Davonna Belling [phone]) on 09/18/2017 8:17:25 PM       Radiology Dg Chest 2 View  Result Date: 09/18/2017 CLINICAL DATA:  Facial weakness and speech changes EXAM: CHEST  2 VIEW COMPARISON:  03/13/2010 FINDINGS: Mild cardiomegaly. Small pleural effusions. Moderate hiatal hernia. Infiltrate at the left lung base. Vascular congestion with mild interstitial edema. Aortic atherosclerosis. No pneumothorax. Abnormal appearance of the right shoulder with possible anterior subluxation of the proximal right humerus, possible lytic process involving the right humeral head and glenoid fossa. IMPRESSION: 1. Cardiomegaly with vascular congestion and mild interstitial edema 2. Small pleural effusions.  Patchy infiltrate at the left lung base 3. Moderate hiatal hernia 4. Abnormal appearance of right shoulder with possible subluxation of proximal right humerus and possible lytic change of the right humeral head and glenoid fossa. Recommend dedicated right shoulder  radiographs. Electronically Signed   By: Donavan Foil M.D.   On: 09/18/2017 19:26   Ct Head Wo Contrast  Result Date: 09/18/2017 CLINICAL DATA:  Facial droop and left-sided weakness with slurred speech EXAM: CT HEAD WITHOUT CONTRAST TECHNIQUE: Contiguous axial images were obtained from the base of the skull through the vertex without intravenous contrast. COMPARISON:  MRI 08/12/2014, CT brain 08/19/2017 FINDINGS: Brain: No acute territorial infarction, hemorrhage or new intracranial mass is visualized. Re- demonstrated partially calcified right parasellar mass measuring 2.4 x 2.2 cm, previously characterized as meningioma. Mild small vessel ischemic changes of the white matter. Mild atrophy. Stable ventricle size Vascular: No hyperdense vessels.  Carotid artery calcification. Skull: Normal. Negative for fracture or focal lesion. Sinuses/Orbits: Mild mucosal thickening in the sphenoid and ethmoid sinuses. No acute  orbital abnormality Other: None IMPRESSION: 1. No CT evidence for acute intracranial abnormality. 2. Stable 2.4 cm partially calcified right parasellar mass consistent with history of meningioma. 3. Atrophy and mild small vessel ischemic changes of the white matter Electronically Signed   By: Donavan Foil M.D.   On: 09/18/2017 19:23    Procedures Procedures (including critical care time)  Medications Ordered in ED Medications - No data to display   Initial Impression / Assessment and Plan / ED Course  I have reviewed the triage vital signs and the nursing notes.  Pertinent labs & imaging results that were available during my care of the patient were reviewed by me and considered in my medical decision making (see chart for details).     Patient with left-sided weakness and difficulty speaking.  Had facial droop.  Symptoms have now resolved.  TIA potentially.  Has also known right-sided meningioma with some MCA involvement.  Will admit to hospitalist for stroke rule out.  Symptoms  have now all resolved so not a TPA candidate.  Final Clinical Impressions(s) / ED Diagnoses   Final diagnoses:  TIA (transient ischemic attack)    ED Discharge Orders    None       Davonna Belling, MD 09/18/17 2018

## 2017-09-18 NOTE — ED Notes (Signed)
Nurse currently drawing labs 

## 2017-09-18 NOTE — ED Notes (Signed)
Patient transported to CT 

## 2017-09-18 NOTE — Consult Note (Signed)
Neurology Consultation Reason for Consult: TIA Referring Physician: Robyn Haber  CC: TIA   History is obtained from: Patient  HPI: [name] is a 83 y.o. female with a history of meningioma encased in the right MCA with transient left-sided weakness.  Her daughter states that she saw her slumped to the left.  She had stepped away for just a moment, she suspects it was less than a few seconds, and when she returned she was slumped over to the side.  There was no loss of consciousness, no shaking.  The patient is not aware that she had left-sided weakness, but the daughter states that she was very clearly severely weak on the left side.  This lasted for a few minutes and resolved.  She is currently back to baseline.  LKW: 1700 tpa given?: no, resolution of symptoms    ROS: A 14 point ROS was performed and is negative except as noted in the HPI.   Past Medical History:  Diagnosis Date  . Anemia   . Arthritis   . History of umbilical hernia repair   . Hypertension      Family History  Problem Relation Age of Onset  . Heart disease Father      Social History:  reports that  has never smoked. She does not have any smokeless tobacco history on file. She reports that she does not drink alcohol or use drugs.   Exam: Current vital signs: BP (!) 163/79 (BP Location: Left Arm)   Pulse 60   Temp 98 F (36.7 C)   Resp (!) 22   Ht 4\' 11"  (1.499 m)   Wt 55.3 kg (122 lb)   SpO2 100%   BMI 24.64 kg/m  Vital signs in last 24 hours: Temp:  [97.8 F (36.6 C)-98 F (36.7 C)] 98 F (36.7 C) (12/28 2027) Pulse Rate:  [51-60] 60 (12/28 2200) Resp:  [16-22] 22 (12/28 2200) BP: (151-174)/(68-107) 163/79 (12/28 2200) SpO2:  [100 %] 100 % (12/28 2200) Weight:  [55.3 kg (122 lb)] 55.3 kg (122 lb) (12/28 1813)   Physical Exam  Constitutional: Appears well-developed and well-nourished.  Psych: Affect appropriate to situation Eyes: No scleral injection HENT: No OP  obstrucion Head: Normocephalic.  Cardiovascular: Normal rate and regular rhythm.  Respiratory: Effort normal, non-labored breathing GI: Soft.  No distension. There is no tenderness.  Skin: WDI  Neuro: Mental Status: Patient is awake, alert, oriented to person, place, month, year, and situation. Patient is able to give a clear and coherent history. No signs of aphasia or neglect Cranial Nerves: II: Visual Fields are full. Pupils are equal, round, and reactive to light.   III,IV, VI: EOMI without ptosis or diploplia.  V: Facial sensation is symmetric to temperature VII: Facial movement is symmetric.  VIII: hearing is intact to voice X: Uvula elevates symmetrically XI: Shoulder shrug is symmetric. XII: tongue is midline without atrophy or fasciculations.  Motor: Tone is normal. Bulk is normal. 5/5 strength was present in all four extremities.  Sensory: Sensation is symmetric to light touch and temperature in the arms and legs. Cerebellar: She has significant intentional tremor bilaterally.     I have reviewed labs in epic and the results pertinent to this consultation are: CMP-unremarkable  I have reviewed the images obtained: CT head-previous meningioma, no acute findings  Impression: 83 year old female with symptoms referable to the right MCA territory.  I suspect this is TIA, possibly due to stenosis from the meningioma.  No evidence of  seizure activity or symptoms consistent with that.  She will need admission for further workup.  Recommendations: 1. HgbA1c, fasting lipid panel 2. MRI, MRA  of the brain without contrast 3. Frequent neuro checks 4. Echocardiogram 5. Carotid dopplers 6. Prophylactic therapy-Antiplatelet med: Aspirin - dose 325mg  PO or 300mg  PR 7. Risk factor modification 8. Telemetry monitoring 9. PT consult, OT consult, Speech consult 10. please page stroke NP  Or  PA  Or MD  from 8am -4 pm as this patient will be followed by the stroke team at this  point.   You can look them up on [email or web address]      Roland Rack, MD [facility] [phone]  If 7pm- 7am, please page neurology on call as listed in [place].

## 2017-09-18 NOTE — ED Notes (Signed)
ED Provider at bedside. 

## 2017-09-18 NOTE — ED Triage Notes (Signed)
Pt BIB EMS from home for sudden onset of stroke like symptoms. Per EMS pt daughter noticed left facial droop, left sided weakness, and slurred speech at 1700; Symptoms resolved while in the EMS truck. Pt A&Ox4 on arrival, no arm weakness; slight left sided facial droop and slurred speech; Pt almost back at baseline per pt daughter at bedside.

## 2017-09-19 ENCOUNTER — Observation Stay (HOSPITAL_COMMUNITY): Payer: Medicare Other

## 2017-09-19 ENCOUNTER — Encounter (HOSPITAL_COMMUNITY): Payer: Medicare Other

## 2017-09-19 ENCOUNTER — Encounter (HOSPITAL_COMMUNITY): Payer: Self-pay | Admitting: Internal Medicine

## 2017-09-19 ENCOUNTER — Observation Stay (HOSPITAL_BASED_OUTPATIENT_CLINIC_OR_DEPARTMENT_OTHER): Payer: Medicare Other

## 2017-09-19 DIAGNOSIS — I10 Essential (primary) hypertension: Secondary | ICD-10-CM

## 2017-09-19 DIAGNOSIS — I63511 Cerebral infarction due to unspecified occlusion or stenosis of right middle cerebral artery: Secondary | ICD-10-CM | POA: Diagnosis not present

## 2017-09-19 DIAGNOSIS — I340 Nonrheumatic mitral (valve) insufficiency: Secondary | ICD-10-CM | POA: Diagnosis not present

## 2017-09-19 DIAGNOSIS — I679 Cerebrovascular disease, unspecified: Secondary | ICD-10-CM | POA: Diagnosis not present

## 2017-09-19 DIAGNOSIS — I6521 Occlusion and stenosis of right carotid artery: Secondary | ICD-10-CM | POA: Diagnosis not present

## 2017-09-19 DIAGNOSIS — G459 Transient cerebral ischemic attack, unspecified: Secondary | ICD-10-CM | POA: Diagnosis not present

## 2017-09-19 DIAGNOSIS — D329 Benign neoplasm of meninges, unspecified: Secondary | ICD-10-CM | POA: Diagnosis not present

## 2017-09-19 DIAGNOSIS — I6522 Occlusion and stenosis of left carotid artery: Secondary | ICD-10-CM

## 2017-09-19 DIAGNOSIS — E785 Hyperlipidemia, unspecified: Secondary | ICD-10-CM | POA: Diagnosis not present

## 2017-09-19 LAB — CBC
HCT: 35.3 % — ABNORMAL LOW (ref 36.0–46.0)
HEMOGLOBIN: 11.3 g/dL — AB (ref 12.0–15.0)
MCH: 31.3 pg (ref 26.0–34.0)
MCHC: 32 g/dL (ref 30.0–36.0)
MCV: 97.8 fL (ref 78.0–100.0)
PLATELETS: 249 10*3/uL (ref 150–400)
RBC: 3.61 MIL/uL — ABNORMAL LOW (ref 3.87–5.11)
RDW: 13.5 % (ref 11.5–15.5)
WBC: 7.1 10*3/uL (ref 4.0–10.5)

## 2017-09-19 LAB — LIPID PANEL
CHOL/HDL RATIO: 2.4 ratio
CHOLESTEROL: 151 mg/dL (ref 0–200)
HDL: 64 mg/dL (ref >40)
LDL Cholesterol: 75 mg/dL (ref 0–99)
Triglycerides: 62 mg/dL (ref <150)
VLDL: 12 mg/dL (ref 0–40)

## 2017-09-19 LAB — PROCALCITONIN

## 2017-09-19 LAB — TSH: TSH: 3.515 u[IU]/mL (ref 0.350–4.500)

## 2017-09-19 LAB — HEMOGLOBIN A1C
Hgb A1c MFr Bld: 5.1 % (ref 4.8–5.6)
Mean Plasma Glucose: 99.67 mg/dL

## 2017-09-19 LAB — ECHOCARDIOGRAM COMPLETE
HEIGHTINCHES: 59 in
WEIGHTICAEL: 1883.61 [oz_av]

## 2017-09-19 MED ORDER — CLOPIDOGREL BISULFATE 75 MG PO TABS
75.0000 mg | ORAL_TABLET | Freq: Every day | ORAL | Status: DC
  Administered 2017-09-19 – 2017-09-20 (×2): 75 mg via ORAL
  Filled 2017-09-19 (×2): qty 1

## 2017-09-19 MED ORDER — LEVOTHYROXINE SODIUM 50 MCG PO TABS
50.0000 ug | ORAL_TABLET | Freq: Every day | ORAL | Status: DC
  Administered 2017-09-19 – 2017-09-27 (×8): 50 ug via ORAL
  Filled 2017-09-19 (×9): qty 1

## 2017-09-19 MED ORDER — IOPAMIDOL (ISOVUE-370) INJECTION 76%
INTRAVENOUS | Status: AC
  Administered 2017-09-19: 50 mL
  Filled 2017-09-19: qty 50

## 2017-09-19 MED ORDER — PRAVASTATIN SODIUM 20 MG PO TABS
20.0000 mg | ORAL_TABLET | Freq: Every day | ORAL | Status: DC
  Administered 2017-09-19 – 2017-09-26 (×6): 20 mg via ORAL
  Filled 2017-09-19 (×7): qty 1

## 2017-09-19 MED ORDER — FLUOXETINE HCL 20 MG PO CAPS
40.0000 mg | ORAL_CAPSULE | Freq: Every day | ORAL | Status: DC
  Administered 2017-09-19 – 2017-09-27 (×8): 40 mg via ORAL
  Filled 2017-09-19 (×8): qty 2

## 2017-09-19 MED ORDER — POTASSIUM CHLORIDE CRYS ER 20 MEQ PO TBCR
40.0000 meq | EXTENDED_RELEASE_TABLET | Freq: Every morning | ORAL | Status: DC
  Administered 2017-09-19 – 2017-09-26 (×7): 40 meq via ORAL
  Filled 2017-09-19 (×8): qty 2

## 2017-09-19 MED ORDER — ASPIRIN 325 MG PO TABS
325.0000 mg | ORAL_TABLET | Freq: Every day | ORAL | Status: DC
  Administered 2017-09-19 – 2017-09-20 (×2): 325 mg via ORAL
  Filled 2017-09-19 (×2): qty 1

## 2017-09-19 MED ORDER — ONE-A-DAY WOMENS FORMULA PO TABS: 1.0000 | ORAL_TABLET | Freq: Every day | ORAL | Status: DC

## 2017-09-19 MED ORDER — BUPROPION HCL ER (SR) 150 MG PO TB12
150.0000 mg | ORAL_TABLET | Freq: Two times a day (BID) | ORAL | Status: DC
  Administered 2017-09-19 – 2017-09-26 (×16): 150 mg via ORAL
  Filled 2017-09-19 (×18): qty 1

## 2017-09-19 MED ORDER — ENOXAPARIN SODIUM 30 MG/0.3ML ~~LOC~~ SOLN
30.0000 mg | SUBCUTANEOUS | Status: DC
  Administered 2017-09-19 – 2017-09-20 (×2): 30 mg via SUBCUTANEOUS
  Filled 2017-09-19 (×2): qty 0.3

## 2017-09-19 MED ORDER — FERROUS SULFATE 325 (65 FE) MG PO TABS
325.0000 mg | ORAL_TABLET | Freq: Two times a day (BID) | ORAL | Status: DC
  Administered 2017-09-19 – 2017-09-24 (×8): 325 mg via ORAL
  Filled 2017-09-19 (×10): qty 1

## 2017-09-19 MED ORDER — ACETAMINOPHEN 650 MG RE SUPP: 650.0000 mg | RECTAL | Status: DC | PRN

## 2017-09-19 MED ORDER — ACETAMINOPHEN 160 MG/5ML PO SOLN: 650.0000 mg | ORAL | Status: DC | PRN

## 2017-09-19 MED ORDER — ADULT MULTIVITAMIN W/MINERALS CH
1.0000 | ORAL_TABLET | Freq: Every day | ORAL | Status: DC
  Administered 2017-09-19 – 2017-09-27 (×8): 1 via ORAL
  Filled 2017-09-19 (×8): qty 1

## 2017-09-19 MED ORDER — ALPRAZOLAM 0.5 MG PO TABS
0.5000 mg | ORAL_TABLET | Freq: Every day | ORAL | Status: DC
  Administered 2017-09-19 – 2017-09-27 (×9): 0.5 mg via ORAL
  Filled 2017-09-19 (×10): qty 1

## 2017-09-19 MED ORDER — ASPIRIN 300 MG RE SUPP: 300.0000 mg | Freq: Every day | RECTAL | Status: DC

## 2017-09-19 MED ORDER — STROKE: EARLY STAGES OF RECOVERY BOOK
Freq: Once | Status: AC
  Administered 2017-09-19: 01:00:00

## 2017-09-19 MED ORDER — TIMOLOL MALEATE 0.5 % OP SOLN
1.0000 [drp] | Freq: Two times a day (BID) | OPHTHALMIC | Status: DC
  Administered 2017-09-19 – 2017-10-01 (×20): 1 [drp] via OPHTHALMIC
  Filled 2017-09-19 (×3): qty 5

## 2017-09-19 MED ORDER — ACETAMINOPHEN 325 MG PO TABS: 650.0000 mg | ORAL_TABLET | ORAL | Status: DC | PRN

## 2017-09-19 MED ORDER — FUROSEMIDE 80 MG PO TABS
80.0000 mg | ORAL_TABLET | Freq: Every morning | ORAL | Status: DC
  Administered 2017-09-19 – 2017-09-20 (×2): 80 mg via ORAL
  Filled 2017-09-19 (×2): qty 1

## 2017-09-19 MED ORDER — CARVEDILOL 12.5 MG PO TABS
25.0000 mg | ORAL_TABLET | Freq: Two times a day (BID) | ORAL | Status: DC
  Administered 2017-09-19 – 2017-09-20 (×3): 25 mg via ORAL
  Filled 2017-09-19 (×4): qty 2

## 2017-09-19 MED ORDER — LISINOPRIL 20 MG PO TABS
40.0000 mg | ORAL_TABLET | Freq: Every day | ORAL | Status: DC
  Administered 2017-09-19 – 2017-09-20 (×2): 40 mg via ORAL
  Filled 2017-09-19 (×2): qty 2

## 2017-09-19 MED ORDER — PANTOPRAZOLE SODIUM 40 MG PO TBEC
40.0000 mg | DELAYED_RELEASE_TABLET | Freq: Every day | ORAL | Status: DC
  Administered 2017-09-19 – 2017-09-21 (×3): 40 mg via ORAL
  Filled 2017-09-19 (×3): qty 1

## 2017-09-19 NOTE — Progress Notes (Signed)
agree with HPI as per admitting MD  89 fem htn, chf, hypothyroid, meningioma-came to ED 2/2 L side weak, L facial droop   BP (!) 162/60 (BP Location: Left Arm)   Pulse (!) 59   Temp 99.1 F (37.3 C) (Oral)   Resp 18   Ht 4\' 11"  (1.499 m)   Wt 53.4 kg (117 lb 11.6 oz)   SpO2 97%   BMI 23.78 kg/m   O/e Awake alert power UE limited by arthritis-equally raises arms above head Soft m LLSE, no gallop tele-sinus brady abd soft Finger nose finger intact reflexes not tested  MR confirms acute stroke Neuro consulted and getting diagnostic work-up currently Await therapy evals  Likely d/c once work-up complete    Verneita Griffes, MD [facility] [phone]

## 2017-09-19 NOTE — Evaluation (Addendum)
Physical Therapy Evaluation Patient Details Name: [name] MRN: [id number] DOB: 1923/09/21 Today's Date: 09/19/2017   History of Present Illness  [name] is a 83 y.o. female with history of hypertension, possibly CHF, hypothyroidism and meningioma was brought to the ER after patient was found to have left-sided weakness. CT negative for acute changes, showed meningioma.  PMH positive for anemia, arthritis and HTN.  MRI positive for Acute/subacute nonhemorrhagic infarct involving the anterior right insular ribbon and right frontal operculum..  Clinical Impression  Patient presents with decreased independence with mobility due to weakness, falls, poor safety awareness and will benefit from skilled PT in the acute setting.  Currently min to mod A for mobility and daughter reports she has help at home, but pt relates frequent falls.  Feel pt may benefit from HHPT at d/c as well.     Follow Up Recommendations Home health PT;Supervision/Assistance - 24 hour    Equipment Recommendations  Other (comment)(daughter interested in transport chair)    Recommendations for Other Services       Precautions / Restrictions Precautions Precautions: Fall Precaution Comments: reports multiple falls at home Restrictions Weight Bearing Restrictions: No      Mobility  Bed Mobility Overal bed mobility: Needs Assistance Bed Mobility: Supine to Sit     Supine to sit: Min assist     General bed mobility comments: up in chair following OT  Transfers Overall transfer level: Needs assistance Equipment used: 4-wheeled walker Transfers: Sit to/from Stand Sit to Stand: Min assist;Mod assist         General transfer comment: min assist from recliner with walker in front, mod A from rollator seat without UE support and pt incontinent of urine  Ambulation/Gait Ambulation/Gait assistance: Min assist Ambulation Distance (Feet): 20 Feet(x 2) Assistive device: 4-wheeled walker Gait  Pattern/deviations: Shuffle;Trunk flexed;Decreased stride length     General Gait Details: increased proximity to walker and shuffling feet, high fall risk  Stairs            Wheelchair Mobility    Modified Rankin (Stroke Patients Only)       Balance Overall balance assessment: History of Falls;Needs assistance   Sitting balance-Leahy Scale: Fair     Standing balance support: Bilateral upper extremity supported Standing balance-Leahy Scale: Poor Standing balance comment: standing at sink for hygiene following incontinent episode with UE supported on sink                             Pertinent Vitals/Pain Pain Assessment: Faces Faces Pain Scale: Hurts a little bit Pain Location: shoulders Pain Descriptors / Indicators: Discomfort Pain Intervention(s): Limited activity within patient's tolerance    Home Living Family/patient expects to be discharged to:: Private residence Living Arrangements: Alone Available Help at Discharge: Family;Personal care attendant Type of Home: House Home Access: Stairs to enter Entrance Stairs-Rails: Right Entrance Stairs-Number of Steps: not sure of number, needs assist for stairs Home Layout: One level Home Equipment: Walker - 4 wheels;Other (comment);Walker - 2 wheels;Bedside commode;Shower seat - built in;Grab bars - toilet;Grab bars - tub/shower;Hand held shower head Additional Comments: lift chair, life alert    Prior Function Level of Independence: Needs assistance   Gait / Transfers Assistance Needed: uses rollator to ambulate, has assist from aide in mornings and daughter in evenings, but daughter stays weekends.  ADL's / Homemaking Assistance Needed: assist for shower and dressing due to limited AROM in shoulders  Hand Dominance        Extremity/Trunk Assessment   Upper Extremity Assessment Upper Extremity Assessment: Defer to OT evaluation    Lower Extremity Assessment Lower Extremity  Assessment: Generalized weakness    Cervical / Trunk Assessment Cervical / Trunk Assessment: Kyphotic  Communication   Communication: HOH  Cognition Arousal/Alertness: Awake/alert Behavior During Therapy: WFL for tasks assessed/performed Overall Cognitive Status: No family/caregiver present to determine baseline cognitive functioning                                 General Comments: cognition most likely baseline; Pt thinking that her daughter was in the room initially      General Comments General comments (skin integrity, edema, etc.): OT in room speaking on phone to pt's daughter during initial part of evaluation.      Exercises     Assessment/Plan    PT Assessment Patient needs continued PT services  PT Problem List Decreased strength;Decreased activity tolerance;Decreased balance;Decreased mobility;Cardiopulmonary status limiting activity;Decreased safety awareness;Decreased knowledge of use of DME       PT Treatment Interventions DME instruction;Balance training;Gait training;Functional mobility training;Patient/family education;Stair training;Therapeutic activities;Therapeutic exercise    PT Goals (Current goals can be found in the Care Plan section)  Acute Rehab PT Goals Patient Stated Goal: return home PT Goal Formulation: With patient/family Time For Goal Achievement: 09/26/17 Potential to Achieve Goals: Fair    Frequency Min 3X/week   Barriers to discharge        Co-evaluation               AM-PAC PT "6 Clicks" Daily Activity  Outcome Measure Difficulty turning over in bed (including adjusting bedclothes, sheets and blankets)?: Unable Difficulty moving from lying on back to sitting on the side of the bed? : Unable Difficulty sitting down on and standing up from a chair with arms (e.g., wheelchair, bedside commode, etc,.)?: Unable Help needed moving to and from a bed to chair (including a wheelchair)?: A Little Help needed walking in  hospital room?: A Little Help needed climbing 3-5 steps with a railing? : A Lot 6 Click Score: 11    End of Session Equipment Utilized During Treatment: Gait belt Activity Tolerance: Patient limited by fatigue Patient left: in chair;with call bell/phone within reach;with chair alarm set   PT Visit Diagnosis: History of falling (Z91.81);Other abnormalities of gait and mobility (R26.89);Muscle weakness (generalized) (M62.81)    Time: 5465-0354 PT Time Calculation (min) (ACUTE ONLY): 24 min   Charges:   PT Evaluation $PT Eval Moderate Complexity: 1 Mod PT Treatments $Gait Training: 8-22 mins   PT G CodesMagda Cisneros, [place] [phone] 09/19/2017   Reginia Naas 09/19/2017, 1:32 PM

## 2017-09-19 NOTE — Progress Notes (Signed)
STROKE TEAM PROGRESS NOTE   SUBJECTIVE (INTERVAL HISTORY) No family is at the bedside.  Patient sitting in chair without distress.  No complaints.  Asymptomatic.  MRI showed right MCA moderate-sized infarct.  CTA head neck showed left ICA chronic occlusion, right M2 occlusion corresponding to right MCA infarct.  PT OT recommend home health.   Home Medications:  Current Meds  Medication Sig  . ALPRAZolam (XANAX) 0.5 MG tablet Take 0.5 mg by mouth at bedtime.   Marland Kitchen aspirin 81 MG tablet Take 81 mg by mouth daily.  Marland Kitchen buPROPion (WELLBUTRIN SR) 150 MG 12 hr tablet Take 150 mg by mouth 2 (two) times daily.  . carvedilol (COREG) 25 MG tablet Take 25 mg by mouth 2 (two) times daily with a meal.  . Cholecalciferol (VITAMIN D-3) 1000 units CAPS Take 1,000-2,000 Units by mouth 2 (two) times daily.   Marland Kitchen esomeprazole (NEXIUM) 40 MG capsule Take 40 mg by mouth daily at 12 noon.  . ferrous sulfate 325 (65 FE) MG tablet Take 325 mg by mouth 2 (two) times daily with a meal.   . FLUoxetine (PROZAC) 40 MG capsule Take 40 mg by mouth daily.  . furosemide (LASIX) 40 MG tablet Take 80 mg by mouth every morning.   Marland Kitchen levothyroxine (SYNTHROID, LEVOTHROID) 50 MCG tablet Take 50 mcg by mouth daily before breakfast.  . lisinopril (PRINIVIL,ZESTRIL) 40 MG tablet Take 40 mg by mouth daily.  . Multiple Vitamins-Calcium (ONE-A-DAY WOMENS FORMULA) TABS Take 1 tablet by mouth daily.  . potassium chloride SA (K-DUR,KLOR-CON) 20 MEQ tablet Take 40 mEq by mouth every morning.  . timolol (TIMOPTIC) 0.5 % ophthalmic solution Place 1 drop into both eyes 2 (two) times daily.      Hospital Medications:  . ALPRAZolam  0.5 mg Oral QHS  . aspirin  300 mg Rectal Daily   Or  . aspirin  325 mg Oral Daily  . buPROPion  150 mg Oral BID  . carvedilol  25 mg Oral BID WC  . clopidogrel  75 mg Oral Daily  . enoxaparin (LOVENOX) injection  30 mg Subcutaneous Q24H  . ferrous sulfate  325 mg Oral BID WC  . FLUoxetine  40 mg Oral Daily  .  furosemide  80 mg Oral q morning - 10a  . levothyroxine  50 mcg Oral QAC breakfast  . lisinopril  40 mg Oral Daily  . multivitamin with minerals  1 tablet Oral Daily  . pantoprazole  40 mg Oral Daily  . potassium chloride SA  40 mEq Oral q morning - 10a  . pravastatin  20 mg Oral q1800  . timolol  1 drop Both Eyes BID    OBJECTIVE Temp:  [97.3 F (36.3 C)-98.1 F (36.7 C)] 97.3 F (36.3 C) (12/29 0440) Pulse Rate:  [51-64] 58 (12/29 0640) Cardiac Rhythm: Sinus bradycardia (12/29 0724) Resp:  [16-22] 18 (12/29 0640) BP: (133-199)/(51-107) 136/61 (12/29 0640) SpO2:  [58 %-100 %] 58 % (12/29 0640) FiO2 (%):  [21 %] 21 % (12/29 0010) Weight:  [117 lb 11.6 oz (53.4 kg)-122 lb (55.3 kg)] 117 lb 11.6 oz (53.4 kg) (12/28 2250)  CBC:  Recent Labs  Lab 09/18/17 1921 09/18/17 1937 09/19/17 0408  WBC 6.6  --  7.1  NEUTROABS 4.4  --   --   HGB 12.7 12.6 11.3*  HCT 39.3 37.0 35.3*  MCV 97.8  --  97.8  PLT 254  --  644    Basic Metabolic Panel:  Recent Labs  Lab 09/18/17 1921 09/18/17 1937  NA 140 141  K 4.2 4.3  CL 103 103  CO2 29  --   GLUCOSE 96 92  BUN 16 19  CREATININE 0.92 1.00  CALCIUM 9.6  --     Lipid Panel:     Component Value Date/Time   CHOL 151 09/19/2017 0408   TRIG 62 09/19/2017 0408   HDL 64 09/19/2017 0408   CHOLHDL 2.4 09/19/2017 0408   VLDL 12 09/19/2017 0408   LDLCALC 75 09/19/2017 0408   HgbA1c:  Lab Results  Component Value Date   HGBA1C 5.1 09/19/2017   Urine Drug Screen:     Component Value Date/Time   LABOPIA NONE DETECTED 09/18/2017 1824   COCAINSCRNUR NONE DETECTED 09/18/2017 1824   LABBENZ NONE DETECTED 09/18/2017 1824   AMPHETMU NONE DETECTED 09/18/2017 1824   THCU NONE DETECTED 09/18/2017 1824   LABBARB NONE DETECTED 09/18/2017 1824    Alcohol Level     Component Value Date/Time   ETH <10 09/18/2017 1921    IMAGING I have personally reviewed the radiological images below and agree with the radiology  interpretations.  Ct Head Wo Contrast 09/18/2017 IMPRESSION:  1. No CT evidence for acute intracranial abnormality.  2. Stable 2.4 cm partially calcified right parasellar mass consistent with history of meningioma.  3. Atrophy and mild small vessel ischemic changes of the white matter   MRI Brain WO Contrast  09/19/2017 IMPRESSION: 1. Acute/subacute nonhemorrhagic infarct involving the anterior right insular ribbon and right frontal operculum. 2. Slight interval growth of right sphenoid wing meningioma with local mass effect. 3. Chronic occlusion of the left internal carotid artery below the ophthalmic segment. 4. Stable atrophy and white matter disease bilaterally.  CTA Head and Neck  09/19/2017 IMPRESSION: 1. Proximal anterior right M2 segment occlusion associated with the acute/subacute nonhemorrhagic infarct of the anterior right insular cortex and right frontal operculum. 2. 2.7 cm sphenoid wing meningioma displaces the right ICA terminus and M1 segment without occlusion.  3. Left internal carotid artery occlusion just beyond the bifurcation with reconstitution at the ophthalmic segment. 4. Diffuse distal small vessel disease without MCA and PCA branch vessels bilaterally. 5. Dense atherosclerotic calcifications at the right carotid bifurcation without significant stenosis. 6. Dense atherosclerotic calcifications at the origin of the right vertebral artery proximally and within the right common carotid carotid artery without significant stenosis. 7.  Aortic Atherosclerosis (ICD10-I70.0). 8. Bilateral pleural effusions. 9. Micronodular airspace disease the lung apices, right greater than left. This is concerning for atypical infection.  Transthoracic Echocardiogram 09/19/2017 - LVEF 65-70%, moderate LVH, normal wall motion, mild calcific   aortic valve stenosis, mild MR, severe LAE, trivial TR, RVSP 30   mmHg, normal IVC.    PHYSICAL EXAM  Temp:  [97.3 F (36.3  C)-99.1 F (37.3 C)] 99 F (37.2 C) (12/29 1346) Pulse Rate:  [51-64] 64 (12/29 1346) Resp:  [16-22] 18 (12/29 1346) BP: (133-199)/(51-107) 143/98 (12/29 1346) SpO2:  [58 %-100 %] 99 % (12/29 1346) FiO2 (%):  [21 %] 21 % (12/29 0010) Weight:  [117 lb 11.6 oz (53.4 kg)-122 lb (55.3 kg)] 117 lb 11.6 oz (53.4 kg) (12/28 2250)  General - Well nourished, well developed, in no apparent distress.  Ophthalmologic - Fundi not visualized due to noncooperation.  Cardiovascular - Regular rate and rhythm.  Mental Status -  Level of arousal and orientation to time, place, and person were intact. Language including expression, naming, repetition, comprehension was assessed and found intact.  Cranial Nerves II - XII - II - Visual field intact OU. III, IV, VI - Extraocular movements intact. V - Facial sensation intact bilaterally. VII - Facial movement intact bilaterally. VIII - Hearing & vestibular intact bilaterally. X - Palate elevates symmetrically. XI - Chin turning & shoulder shrug intact bilaterally. XII - Tongue protrusion intact.  Motor Strength - The patient's strength was 4/5 in all extremities and pronator drift was absent.  Bulk was normal and fasciculations were absent.   Motor Tone - Muscle tone was assessed at the neck and appendages and was normal.  Reflexes - The patient's reflexes were 1+ in all extremities and she had no pathological reflexes.  Sensory - Light touch, temperature/pinprick were assessed and were symmetrical.    Coordination - The patient had normal movements in the hands with no ataxia or dysmetria.  Tremor was absent.  Gait and Station - deferred   ASSESSMENT/PLAN Ms. [name] is a 83 y.o. female with history of hypertension, anemia, history of meningioma in the right sphenoid wing, presenting with transient left-sided weakness now back to baseline. She did not receive IV t-PA due to resolution of deficits.  Stroke:  right MCA moderate-sized  infarct, cardioembolic versus right ICA atherosclerosis  Resultant back to baseline  CT head - Stable 2.4 cm partially calcified Rt. parasellar mass consistent with history of meningioma.   MRI head - Acute/subacute nonhemorrhagic infarct involving the anterior right insular ribbon and right frontal operculum.  CTA H&N - Proximal anterior right M2 segment occlusion associated with the acute/subacute nonhemorrhagic infarct of the anterior right insular cortex and right frontal operculum. 2.7 cm sphenoid wing meningioma displaces the right ICA terminus and M1 segment without occlusion. Left internal carotid artery occlusion just beyond the bifurcation with reconstitution at the ophthalmic segment.  2D Echo - EF 65-70%  Recommend 30-day cardiac event monitoring as outpatient to rule out A. fib  LDL - 75  HgbA1c - 5.1  VTE prophylaxis - Lovenox  Diet Heart Room service appropriate? Yes; Fluid consistency: Thin  aspirin 81 mg daily prior to admission, now on aspirin 325 mg daily and clopidogrel 75 mg daily due to intracranial stenosis.  Continue DAPT for 3 months and then Plavix alone  Patient counseled to be compliant with her antithrombotic medications  Ongoing aggressive stroke risk factor management  Therapy recommendations:  HH PT and OT with 24-hour supervision/assistance  Disposition:  Pending   Extra- and intracranial stenosis  MRA in 07/2014 showed left siphon, right M2, and right terminal ICA stenosis  This admission CTA head neck showed diffuse atherosclerosis with right M2 occlusion, left ICA proximal occlusion, right ICA terminus and M1 displaced by meningioma but without occlusion   On DAPT for 3 months and then Plavix alone.  Hypertension  Stable  Permissive hypertension (OK if < 220/120) but gradually normalize in 5-7 days  Long-term BP goal 130-150 due to left ICA occlusion and right MCA stenosis  Hyperlipidemia  Home meds:  No lipid lowering medications  prior to admission  LDL 75, goal < 70  Now on Pravachol 20 mg daily  Continue statin at discharge  Meningioma  Stable large right sphenoid wing meningioma  Stable small left frontal meningioma  Other Stroke Risk Factors  Advanced age  Hx stroke/TIA  Other Active Problems    Hospital day # 0  Neurology will sign off. Please call with questions. Pt will follow up with Cecille Rubin, NP, at [facility] in about 6 weeks. Thanks for  the consult.  Rosalin Hawking, MD PhD Stroke Neurology 09/19/2017 5:07 PM   To contact Stroke Continuity provider, please refer to [email or web address] After hours, contact General Neurology

## 2017-09-19 NOTE — Progress Notes (Signed)
  Echocardiogram 2D Echocardiogram has been performed.  [name] [name] [name] 09/19/2017, 3:12 PM

## 2017-09-19 NOTE — H&P (Addendum)
History and Physical    [name] JIR:[id number] DOB: 1922/10/29 DOA: 09/18/2017  PCP: Lawerance Cruel, MD  Patient coming from: Home.  Chief Complaint: Left-sided weakness.  HPI: [name] is a 83 y.o. female with history of hypertension, possibly CHF, hypothyroidism and meningioma was brought to the ER after patient was found to have left-sided weakness.  As per the patient's daughter at around 5 PM last evening when patient's daughter just stepped out of the room and came back patient was found to have slumped on her chair not moving her left upper and lower extremities.  Also mild left facial droop was seen.  Patient did not lose consciousness or did not have any seizure-like activities or any incontinence of urine or tongue bite.  Patient symptoms lasted for less than 3 minutes following which patient came back to baseline.  ED Course: In the ER patient was appearing nonfocal.  CT head was showing possible meningioma but otherwise nothing acute.  EKG was showing sinus bradycardia.  Neurologist on-call was consulted and patient admitted for further management of TIA.  Review of Systems: As per HPI, rest all negative.   Past Medical History:  Diagnosis Date  . Anemia   . Arthritis   . History of umbilical hernia repair   . Hypertension     Past Surgical History:  Procedure Laterality Date  . APPENDECTOMY    . HERNIA REPAIR    . TOTAL KNEE ARTHROPLASTY     right  . TOTAL KNEE ARTHROPLASTY     left  . UMBILICAL HERNIA REPAIR       reports that  has never smoked. she has never used smokeless tobacco. She reports that she does not drink alcohol or use drugs.  No Known Allergies  Family History  Problem Relation Age of Onset  . Heart disease Father     Prior to Admission medications   Medication Sig Start Date End Date Taking? Authorizing Provider  ALPRAZolam Duanne Moron) 0.5 MG tablet Take 0.5 mg by mouth at bedtime.    Yes [provider]  aspirin 81  MG tablet Take 81 mg by mouth daily.   Yes [provider]  buPROPion (WELLBUTRIN SR) 150 MG 12 hr tablet Take 150 mg by mouth 2 (two) times daily.   Yes [provider]  carvedilol (COREG) 25 MG tablet Take 25 mg by mouth 2 (two) times daily with a meal.   Yes [provider]  Cholecalciferol (VITAMIN D-3) 1000 units CAPS Take 1,000-2,000 Units by mouth 2 (two) times daily.    Yes [provider]  esomeprazole (NEXIUM) 40 MG capsule Take 40 mg by mouth daily at 12 noon.   Yes [provider]  ferrous sulfate 325 (65 FE) MG tablet Take 325 mg by mouth 2 (two) times daily with a meal.    Yes [provider]  FLUoxetine (PROZAC) 40 MG capsule Take 40 mg by mouth daily.   Yes [provider]  furosemide (LASIX) 40 MG tablet Take 80 mg by mouth every morning.    Yes [provider]  levothyroxine (SYNTHROID, LEVOTHROID) 50 MCG tablet Take 50 mcg by mouth daily before breakfast.   Yes [provider]  lisinopril (PRINIVIL,ZESTRIL) 40 MG tablet Take 40 mg by mouth daily.   Yes [provider]  Multiple Vitamins-Calcium (ONE-A-DAY WOMENS FORMULA) TABS Take 1 tablet by mouth daily.   Yes [provider]  potassium chloride SA (K-DUR,KLOR-CON) 20 MEQ tablet  Take 40 mEq by mouth every morning.   Yes [provider]  timolol (TIMOPTIC) 0.5 % ophthalmic solution Place 1 drop into both eyes 2 (two) times daily.   Yes [provider]    Physical Exam: Vitals:   09/18/17 2015 09/18/17 2027 09/18/17 2200 09/18/17 2250  BP: (!) 151/79  (!) 163/79 (!) 199/67  Pulse:   60 62  Resp:   (!) 22 20  Temp:  98 F (36.7 C)  98.1 F (36.7 C)  TempSrc:    Oral  SpO2:   100% 100%  Weight:    53.4 kg (117 lb 11.6 oz)  Height:    4\' 11"  (1.499 m)      Constitutional: Moderately built and nourished. Vitals:   09/18/17 2015 09/18/17 2027 09/18/17 2200 09/18/17 2250  BP: (!) 151/79  (!) 163/79 (!)  199/67  Pulse:   60 62  Resp:   (!) 22 20  Temp:  98 F (36.7 C)  98.1 F (36.7 C)  TempSrc:    Oral  SpO2:   100% 100%  Weight:    53.4 kg (117 lb 11.6 oz)  Height:    4\' 11"  (1.499 m)   Eyes: Anicteric no pallor. ENMT: No discharge from the ears eyes nose or mouth. Neck: No JVD appreciated no mass felt. Respiratory: No rhonchi or crepitations. Cardiovascular: S1-S2 heard no murmurs appreciated. Abdomen: Soft nontender bowel sounds present. Musculoskeletal: No edema.  No joint effusion. Skin: No rash.  Skin appears warm. Neurologic: Alert awake oriented to time place and person.  Moves all extremities 5 x 5.  No facial asymmetry.  Tongue is midline. Psychiatric: Appears normal.  Normal affect.   Labs on Admission: I have personally reviewed following labs and imaging studies  CBC: Recent Labs  Lab 09/18/17 1921 09/18/17 1937  WBC 6.6  --   NEUTROABS 4.4  --   HGB 12.7 12.6  HCT 39.3 37.0  MCV 97.8  --   PLT 254  --    Basic Metabolic Panel: Recent Labs  Lab 09/18/17 1921 09/18/17 1937  NA 140 141  K 4.2 4.3  CL 103 103  CO2 29  --   GLUCOSE 96 92  BUN 16 19  CREATININE 0.92 1.00  CALCIUM 9.6  --    GFR: Estimated Creatinine Clearance: 26.8 mL/min (by C-G formula based on SCr of 1 mg/dL). Liver Function Tests: Recent Labs  Lab 09/18/17 1921  AST 23  ALT 17  ALKPHOS 145*  BILITOT 0.8  PROT 6.7  ALBUMIN 3.7   No results for input(s): LIPASE, AMYLASE in the last 168 hours. No results for input(s): AMMONIA in the last 168 hours. Coagulation Profile: Recent Labs  Lab 09/18/17 1921  INR 1.08   Cardiac Enzymes: No results for input(s): CKTOTAL, CKMB, CKMBINDEX, TROPONINI in the last 168 hours. BNP (last 3 results) No results for input(s): PROBNP in the last 8760 hours. HbA1C: No results for input(s): HGBA1C in the last 72 hours. CBG: No results for input(s): GLUCAP in the last 168 hours. Lipid Profile: No results for input(s): CHOL, HDL,  LDLCALC, TRIG, CHOLHDL, LDLDIRECT in the last 72 hours. Thyroid Function Tests: No results for input(s): TSH, T4TOTAL, FREET4, T3FREE, THYROIDAB in the last 72 hours. Anemia Panel: No results for input(s): VITAMINB12, FOLATE, FERRITIN, TIBC, IRON, RETICCTPCT in the last 72 hours. Urine analysis:    Component Value Date/Time   COLORURINE STRAW (A) 09/18/2017 1824   APPEARANCEUR CLEAR 09/18/2017 1824  LABSPEC 1.006 09/18/2017 1824   PHURINE 7.0 09/18/2017 1824   GLUCOSEU NEGATIVE 09/18/2017 1824   HGBUR NEGATIVE 09/18/2017 1824   BILIRUBINUR NEGATIVE 09/18/2017 1824   KETONESUR NEGATIVE 09/18/2017 1824   PROTEINUR NEGATIVE 09/18/2017 1824   UROBILINOGEN 0.2 08/30/2010 1430   NITRITE NEGATIVE 09/18/2017 1824   LEUKOCYTESUR NEGATIVE 09/18/2017 1824   Sepsis Labs: @LABRCNTIP (procalcitonin:4,lacticidven:4) )No results found for this or any previous visit (from the past 240 hour(s)).   Radiological Exams on Admission: Dg Chest 2 View  Result Date: 09/18/2017 CLINICAL DATA:  Facial weakness and speech changes EXAM: CHEST  2 VIEW COMPARISON:  03/13/2010 FINDINGS: Mild cardiomegaly. Small pleural effusions. Moderate hiatal hernia. Infiltrate at the left lung base. Vascular congestion with mild interstitial edema. Aortic atherosclerosis. No pneumothorax. Abnormal appearance of the right shoulder with possible anterior subluxation of the proximal right humerus, possible lytic process involving the right humeral head and glenoid fossa. IMPRESSION: 1. Cardiomegaly with vascular congestion and mild interstitial edema 2. Small pleural effusions.  Patchy infiltrate at the left lung base 3. Moderate hiatal hernia 4. Abnormal appearance of right shoulder with possible subluxation of proximal right humerus and possible lytic change of the right humeral head and glenoid fossa. Recommend dedicated right shoulder radiographs. Electronically Signed   By: Donavan Foil M.D.   On: 09/18/2017 19:26   Ct Head Wo  Contrast  Result Date: 09/18/2017 CLINICAL DATA:  Facial droop and left-sided weakness with slurred speech EXAM: CT HEAD WITHOUT CONTRAST TECHNIQUE: Contiguous axial images were obtained from the base of the skull through the vertex without intravenous contrast. COMPARISON:  MRI 08/12/2014, CT brain 08/19/2017 FINDINGS: Brain: No acute territorial infarction, hemorrhage or new intracranial mass is visualized. Re- demonstrated partially calcified right parasellar mass measuring 2.4 x 2.2 cm, previously characterized as meningioma. Mild small vessel ischemic changes of the white matter. Mild atrophy. Stable ventricle size Vascular: No hyperdense vessels.  Carotid artery calcification. Skull: Normal. Negative for fracture or focal lesion. Sinuses/Orbits: Mild mucosal thickening in the sphenoid and ethmoid sinuses. No acute orbital abnormality Other: None IMPRESSION: 1. No CT evidence for acute intracranial abnormality. 2. Stable 2.4 cm partially calcified right parasellar mass consistent with history of meningioma. 3. Atrophy and mild small vessel ischemic changes of the white matter Electronically Signed   By: Donavan Foil M.D.   On: 09/18/2017 19:23    EKG: Independently reviewed.  Sinus bradycardia with RBBB.  Assessment/Plan Active Problems:   TIA (transient ischemic attack)   Essential hypertension    1. TIA -appreciate neurology consult.  Patient has passed swallow.  Patient is placed on neurochecks.  Aspirin.  Check lipid panel and hemoglobin A1c MRI/MRA brain carotid Doppler and 2D echo. 2. Hypertension uncontrolled on lisinopril.  Patient is also on Lasix for CHF. 3. Possible CHF unknown EF -follow 2D echo.  Patient does not complain of any shortness of breath.  Chest x-ray does show some pleural effusion and some infiltrates.  Patient is on Coreg Lasix and lisinopril. 4. Hypothyroidism on Synthroid. 5. History of iron deficiency anemia on replacements. 6. Depression on  antidepressant. 7. Sinus bradycardia -check TSH.  I have placed holding parameters for Coreg.  Chest x-ray does show infiltrates but patient has no symptoms of pneumonia.  Will check pro-calcitonin levels.   DVT prophylaxis: Lovenox. Code Status: Full code. Family Communication: Patient's daughter. Disposition Plan: Home. Consults called: Neurology. Admission status: Observation.   Rise Patience MD [facility] Pager [phone].  If 7PM-7AM, please contact  night-coverage [email or web address] Password [email or web address]  09/19/2017, 12:13 AM

## 2017-09-19 NOTE — Progress Notes (Signed)
Occupational Therapy Evaluation Patient Details Name: [name] MRN: [id number] DOB: Oct 14, 1922 Today's Date: 09/19/2017    History of Present Illness [name] is a 83 y.o. female with history of hypertension, possibly CHF, hypothyroidism and meningioma was brought to the ER after patient was found to have left-sided weakness. CT negative for acute changes, showed meningioma.  PMH positive for anemia, arthritis and HTN.   Clinical Impression   PTA, pt lived alone and had a PCA 5 days/wk. Daughter is with pt in the evenings and assists with ADL and IADL tasks as needed. Pt demonstrates 3/4 dyspnea during ADL task with HR 77 and O2 91 on RA. Pt states she has had several falls at home. Discussed with daughter over the phone and recommend pt have initial 24/7 S after DC and follow up with [facility]. Will follow acutely to facilitate safe DC home.     Follow Up Recommendations  Home health OT;Supervision/Assistance - 24 hour(initial 24/7 S)    Equipment Recommendations  None recommended by OT    Recommendations for Other Services       Precautions / Restrictions Precautions Precautions: Fall Precaution Comments: reports multiple falls at home Restrictions Weight Bearing Restrictions: No      Mobility Bed Mobility Overal bed mobility: Needs Assistance Bed Mobility: Supine to Sit     Supine to sit: Min assist        Transfers Overall transfer level: Needs assistance Equipment used: Rolling walker (2 wheeled) Transfers: Sit to/from Stand Sit to Stand: Min assist;Mod assist(mod A from lower surface)              Balance Overall balance assessment: History of Falls                                         ADL either performed or assessed with clinical judgement   ADL Overall ADL's : Needs assistance/impaired     Grooming: Set up;Supervision/safety;Standing   Upper Body Bathing: Set up;Supervision/ safety;Sitting   Lower Body Bathing:  Minimal assistance;Sit to/from stand   Upper Body Dressing : Moderate assistance;Sitting   Lower Body Dressing: Moderate assistance;Sit to/from stand   Toilet Transfer: Minimal assistance;RW;Grab bars;Ambulation   Toileting- Clothing Manipulation and Hygiene: Moderate assistance Toileting - Clothing Manipulation Details (indicate cue type and reason): incontinent     Functional mobility during ADLs: Minimal assistance;Rolling walker;Cueing for safety General ADL Comments: SOB with minimal activity     Vision         Perception     Praxis      Pertinent Vitals/Pain Pain Assessment: Faces Faces Pain Scale: Hurts a little bit Pain Location: shoulders Pain Descriptors / Indicators: Discomfort Pain Intervention(s): Limited activity within patient's tolerance     Hand Dominance     Extremity/Trunk Assessment Upper Extremity Assessment Upper Extremity Assessment: (B RTC deficieny limiting overhead activity; baseline)   Lower Extremity Assessment Lower Extremity Assessment: Generalized weakness   Cervical / Trunk Assessment Cervical / Trunk Assessment: Kyphotic   Communication Communication Communication: HOH   Cognition Arousal/Alertness: Awake/alert Behavior During Therapy: WFL for tasks assessed/performed Overall Cognitive Status: No family/caregiver present to determine baseline cognitive functioning                                 General Comments: cognition most likely baseline; Pt thinking  that her daughter was in the room initially   General Comments       Exercises     Shoulder Instructions      Home Living Family/patient expects to be discharged to:: Private residence Living Arrangements: Alone Available Help at Discharge: Family;Personal care attendant Type of Home: House Home Access: Stairs to enter [employer] of Steps: not sure of number, needs assist for stairs Entrance Stairs-Rails: Right Home Layout: One level      Bathroom Shower/Tub: Occupational psychologist: Handicapped height Bathroom Accessibility: No   Home Equipment: Environmental consultant - 4 wheels;Other (comment);Walker - 2 wheels;Bedside commode;Shower seat - built in;Grab bars - toilet;Grab bars - tub/shower;Hand held shower head   Additional Comments: lift chair, life alert      Prior Functioning/Environment Level of Independence: Needs assistance  Gait / Transfers Assistance Needed: uses rollator to ambulate, has assist from aide in mornings and daughter in evenings, but daughter stays weekends. ADL's / Homemaking Assistance Needed: assist for shower and dressing due to limited AROM in shoulders            OT Problem List: Decreased strength;Decreased range of motion;Decreased activity tolerance;Impaired balance (sitting and/or standing);Impaired UE functional use;Decreased knowledge of use of DME or AE      OT Treatment/Interventions: Self-care/ADL training;DME and/or AE instruction;Energy conservation;Therapeutic activities;Cognitive remediation/compensation;Patient/family education;Balance training    OT Goals(Current goals can be found in the care plan section) Acute Rehab OT Goals Patient Stated Goal: return home OT Goal Formulation: With patient/family Time For Goal Achievement: 10/04/16 Potential to Achieve Goals: Good  OT Frequency: Min 2X/week   Barriers to D/C:            Co-evaluation              AM-PAC PT "6 Clicks" Daily Activity     Outcome Measure Help from another person eating meals?: None Help from another person taking care of personal grooming?: A Little Help from another person toileting, which includes using toliet, bedpan, or urinal?: A Little Help from another person bathing (including washing, rinsing, drying)?: A Little Help from another person to put on and taking off regular upper body clothing?: A Little Help from another person to put on and taking off regular lower body clothing?: A  Little 6 Click Score: 19   End of Session Equipment Utilized During Treatment: Gait belt;Rolling walker Nurse Communication: Mobility status  Activity Tolerance: Patient tolerated treatment well Patient left: in chair;with call bell/phone within reach;with chair alarm set  OT Visit Diagnosis: Unsteadiness on feet (R26.81);History of falling (Z91.81)                Time: 1025-1110 OT Time Calculation (min): 45 min Charges:  OT General Charges $OT Visit: 1 Visit OT Evaluation $OT Eval Low Complexity: 1 Low OT Treatments $Self Care/Home Management : 23-37 mins G-Codes: OT G-codes **NOT FOR INPATIENT CLASS** Functional Assessment Tool Used: Clinical judgement Functional Limitation: Self care Self Care Current Status (N9892): At least 20 percent but less than 40 percent impaired, limited or restricted Self Care Goal Status (J1941): At least 1 percent but less than 20 percent impaired, limited or restricted   [facility], OT/L  [phone] 09/19/2017  Zared Knoth,HILLARY 09/19/2017, 11:52 AM

## 2017-09-20 LAB — CBC WITH DIFFERENTIAL/PLATELET
Basophils Absolute: 0 10*3/uL (ref 0.0–0.1)
Basophils Relative: 0 %
EOS ABS: 0.1 10*3/uL (ref 0.0–0.7)
Eosinophils Relative: 0 %
HEMATOCRIT: 36.2 % (ref 36.0–46.0)
HEMOGLOBIN: 11.7 g/dL — AB (ref 12.0–15.0)
LYMPHS ABS: 1.3 10*3/uL (ref 0.7–4.0)
LYMPHS PCT: 9 %
MCH: 31.3 pg (ref 26.0–34.0)
MCHC: 32.3 g/dL (ref 30.0–36.0)
MCV: 96.8 fL (ref 78.0–100.0)
MONOS PCT: 6 %
Monocytes Absolute: 0.8 10*3/uL (ref 0.1–1.0)
NEUTROS ABS: 11.5 10*3/uL — AB (ref 1.7–7.7)
Neutrophils Relative %: 85 %
Platelets: 310 10*3/uL (ref 150–400)
RBC: 3.74 MIL/uL — AB (ref 3.87–5.11)
RDW: 13.4 % (ref 11.5–15.5)
WBC: 13.7 10*3/uL — AB (ref 4.0–10.5)

## 2017-09-20 MED ORDER — ONDANSETRON HCL 4 MG/2ML IJ SOLN
4.0000 mg | Freq: Once | INTRAMUSCULAR | Status: AC
  Administered 2017-09-20: 4 mg via INTRAVENOUS
  Filled 2017-09-20: qty 2

## 2017-09-20 MED ORDER — CLOPIDOGREL BISULFATE 75 MG PO TABS: 75.0000 mg | ORAL_TABLET | Freq: Every day | ORAL | 3 refills | Status: DC

## 2017-09-20 MED ORDER — ONDANSETRON 4 MG PO TBDP
4.0000 mg | ORAL_TABLET | Freq: Three times a day (TID) | ORAL | Status: DC | PRN
  Administered 2017-09-20 – 2017-09-22 (×4): 4 mg via ORAL
  Filled 2017-09-20 (×4): qty 1

## 2017-09-20 NOTE — Progress Notes (Signed)
Pt vomited a small amount of coffee ground emesis, approximately 150 ml; ODT zofran administered, and on-call MD Bl[place] paged. Awaiting callback.

## 2017-09-20 NOTE — Discharge Summary (Signed)
Physician Discharge Summary  [name] DUK:[id number] DOB: 05-14-1923 DOA: 09/18/2017  PCP: Lawerance Cruel, MD  Admit date: 09/18/2017 Discharge date: 09/20/2017  Time spent: 35 minutes  Recommendations for Outpatient Follow-up:  1. needs DAPT Rx for storke and then fter 3 mo cut back to plaxix alone 2. HH recommneded 3. Needs 30 day event monitor placed as OP--sending message to card master to set-up 4. Follow with neurology in 6 weeks as OP  Discharge Diagnoses:  Active Problems:   Intracranial vascular stenosis   Essential hypertension   Cerebrovascular accident (CVA) due to occlusion of right middle cerebral artery ([place])   Carotid occlusion, left   Stenosis of right carotid artery   Hyperlipidemia   Meningioma [facility])   Discharge Condition: good  Diet recommendation:  hh low salt  Filed Weights   09/18/17 1813 09/18/17 2250  Weight: 55.3 kg (122 lb) 53.4 kg (117 lb 11.6 oz)    History of present illness:   89 fem htn, chf, hypothyroid, meningioma-came to ED 2/2 L side weak, L facial droop Found to have stroke Neuro consulted rec DAPT x 3 m,o then plavix alone HH Pt ot requested-stabilized for d/c home  Procedures:  Mri ct echo etc   Discharge Exam: Vitals:   09/20/17 1007 09/20/17 1057  BP: (!) 211/71 (!) 158/75  Pulse: 75   Resp: 18   Temp: 97.8 F (36.6 C)   SpO2: 94%     General: alert pleasant in nad nsome n this am  Cardiovascular: s1 s 2no m/r/g Respiratory: clear no added sound No deficit currently  Discharge Instructions   Discharge Instructions    Ambulatory referral to Neurology   Complete by:  As directed    Follow up with stroke clinic Cecille Rubin preferred, if not available, then consider Caesar Chestnut, Leta Baptist or Jaynee Eagles whoever is available) at [facility] in about 6-8 weeks. Thanks.   Diet - low sodium heart healthy   Complete by:  As directed    Diet - low sodium heart healthy   Complete by:  As directed    Increase activity  slowly   Complete by:  As directed    Increase activity slowly   Complete by:  As directed      Allergies as of 09/20/2017   No Known Allergies     Medication List    TAKE these medications   ALPRAZolam 0.5 MG tablet Commonly known as:  XANAX Take 0.5 mg by mouth at bedtime.   aspirin 81 MG tablet Take 81 mg by mouth daily.   buPROPion 150 MG 12 hr tablet Commonly known as:  WELLBUTRIN SR Take 150 mg by mouth 2 (two) times daily.   carvedilol 25 MG tablet Commonly known as:  COREG Take 25 mg by mouth 2 (two) times daily with a meal.   clopidogrel 75 MG tablet Commonly known as:  PLAVIX Take 1 tablet (75 mg total) by mouth daily. Start taking on:  09/21/2017   esomeprazole 40 MG capsule Commonly known as:  NEXIUM Take 40 mg by mouth daily at 12 noon.   ferrous sulfate 325 (65 FE) MG tablet Take 325 mg by mouth 2 (two) times daily with a meal.   FLUoxetine 40 MG capsule Commonly known as:  PROZAC Take 40 mg by mouth daily.   furosemide 40 MG tablet Commonly known as:  LASIX Take 80 mg by mouth every morning.   levothyroxine 50 MCG tablet Commonly known as:  SYNTHROID, LEVOTHROID Take  50 mcg by mouth daily before breakfast.   lisinopril 40 MG tablet Commonly known as:  PRINIVIL,ZESTRIL Take 40 mg by mouth daily.   ONE-A-DAY WOMENS FORMULA Tabs Take 1 tablet by mouth daily.   potassium chloride SA 20 MEQ tablet Commonly known as:  K-DUR,KLOR-CON Take 40 mEq by mouth every morning.   timolol 0.5 % ophthalmic solution Commonly known as:  TIMOPTIC Place 1 drop into both eyes 2 (two) times daily.   Vitamin D-3 1000 units Caps Take 1,000-2,000 Units by mouth 2 (two) times daily.      No Known Allergies Follow-up Information    Dennie Bible, NP. Schedule an appointment as soon as possible for a visit in 6 week(s).   Specialty:  Family Medicine Contact information: [address] [place] [address] [phone]             The results of significant diagnostics from this hospitalization (including imaging, microbiology, ancillary and laboratory) are listed below for reference.    Significant Diagnostic Studies: Ct Angio Head W Or Wo Contrast  Result Date: 09/19/2017 CLINICAL DATA:  Right frontal lobe infarct.  TIA symptoms. EXAM: CT ANGIOGRAPHY HEAD AND NECK TECHNIQUE: Multidetector CT imaging of the head and neck was performed using the standard protocol during bolus administration of intravenous contrast. Multiplanar CT image reconstructions and MIPs were obtained to evaluate the vascular anatomy. Carotid stenosis measurements (when applicable) are obtained utilizing NASCET criteria, using the distal internal carotid diameter as the denominator. CONTRAST:  21mL ISOVUE-370 IOPAMIDOL (ISOVUE-370) INJECTION 76% COMPARISON:  MRI brain from the same day. CT head without contrast 09/18/2017 FINDINGS: CT HEAD FINDINGS Brain: The anterior right insular and operculum infarct is now evident. No other infarcts are present. The basal ganglia are intact. There is enhancement of the meningioma. Vascular: Vascular calcifications are noted. Skull: Calvarium is intact. Sinuses: The paranasal sinuses and mastoid air cells are clear. Orbits: Bilateral lens replacements are present. Globes and orbits are otherwise normal. Review of the MIP images confirms the above findings CTA NECK FINDINGS Aortic arch: A 3 vessel arch configuration is present. Atherosclerotic calcifications are present at the aorta. There is a small ulceration distal to the left subclavian artery. No aneurysm is present. Right carotid system: There is marked tortuosity of the right common carotid artery without a significant stenosis. Dense calcifications are present at the right carotid bifurcation. The minimal luminal diameter is 3 mm. This compares with a more normal distal vessel size of 5 mm. There is mild tortuosity of the cervical right ICA without significant  stenosis. Left carotid system: The left common carotid artery is within normal limits. The bifurcation demonstrates calcified and noncalcified plaque. There is thrombus extending into the lumen. The left internal carotid artery is occluded 1.5 cm above the bifurcation without reconstitution in the neck. Vertebral arteries: Atherosclerotic calcifications are present at the origin of the left subclavian artery without significant stenosis. There is a dense calcification at the origin of the right vertebral artery without significant stenosis. Both vertebral arteries are tortuous proximally. There is no significant stenosis or occlusion of either vertebral artery in the neck. Skeleton: Grade 1 anterolisthesis present at C5-6, measuring 4 mm. Multilevel disc disease is present. No focal lytic or blastic lesions are present. Other neck: The thyroid gland is heterogeneous without a dominant lesion. The gland is enlarged. No significant adenopathy is present. Salivary glands are within normal limits. Upper chest: Bilateral pleural effusions are present. There is dependent atelectasis. Micronodular disease  in the lung apices bilaterally is concerning for infection. Review of the MIP images confirms the above findings CTA HEAD FINDINGS Anterior circulation: Dense atherosclerotic calcifications are present throughout the cavernous right internal carotid artery without a significant stenosis. The left internal carotid artery is reconstituted at the level of the ophthalmic. The terminal right ICA is deviated medially by the meningioma. The vessel then stretches anteriorly around the meningioma. The M1 segment is intact. Left A1 and M1 segments are intact as well. The anterior communicating artery is patent. There is a high-grade stenosis or proximal occlusion of the anterior right M2 branch vessel. Posterior left M2 branch vessels demonstrate some regularity without focal stenosis or occlusion. There is distal branch vessel  irregularity in the left MCA branch vessels is well. Posterior circulation: The vertebral arteries are codominant. PICA origins are visualized and normal. The basilar artery is tortuous somewhat small. Fetal type posterior cerebral arteries are present bilaterally. Small P1 segments are noted. The PCA branch vessels demonstrate distal segmental narrowing without a significant proximal stenosis or occlusion. Venous sinuses: The dural sinuses are patent. The straight sinus and deep cerebral veins are intact. Cortical veins are unremarkable. Anatomic variants: Fetal type posterior cerebral artery is bilaterally. Delayed phase: The postcontrast images demonstrate avid enhancement of the right sphenoid wing meningioma. The right insular cortex and operculum nonhemorrhagic infarct is well-defined on the postcontrast images. No other pathologic enhancement is present. Review of the MIP images confirms the above findings IMPRESSION: 1. Proximal anterior right M2 segment occlusion associated with the acute/subacute nonhemorrhagic infarct of the anterior right insular cortex and right frontal operculum. 2. 2.7 cm sphenoid wing meningioma displaces the right ICA terminus and M1 segment without occlusion. 3. Left internal carotid artery occlusion just beyond the bifurcation with reconstitution at the ophthalmic segment. 4. Diffuse distal small vessel disease without MCA and PCA branch vessels bilaterally. 5. Dense atherosclerotic calcifications at the right carotid bifurcation without significant stenosis. 6. Dense atherosclerotic calcifications at the origin of the right vertebral artery proximally and within the right common carotid carotid artery without significant stenosis. 7.  Aortic Atherosclerosis (ICD10-I70.0). 8. Bilateral pleural effusions. 9. Micronodular airspace disease the lung apices, right greater than left. This is concerning for atypical infection. Electronically Signed   By: San Morelle M.D.   On:  09/19/2017 09:14   Dg Chest 2 View  Result Date: 09/18/2017 CLINICAL DATA:  Facial weakness and speech changes EXAM: CHEST  2 VIEW COMPARISON:  03/13/2010 FINDINGS: Mild cardiomegaly. Small pleural effusions. Moderate hiatal hernia. Infiltrate at the left lung base. Vascular congestion with mild interstitial edema. Aortic atherosclerosis. No pneumothorax. Abnormal appearance of the right shoulder with possible anterior subluxation of the proximal right humerus, possible lytic process involving the right humeral head and glenoid fossa. IMPRESSION: 1. Cardiomegaly with vascular congestion and mild interstitial edema 2. Small pleural effusions.  Patchy infiltrate at the left lung base 3. Moderate hiatal hernia 4. Abnormal appearance of right shoulder with possible subluxation of proximal right humerus and possible lytic change of the right humeral head and glenoid fossa. Recommend dedicated right shoulder radiographs. Electronically Signed   By: Donavan Foil M.D.   On: 09/18/2017 19:26   Ct Head Wo Contrast  Result Date: 09/18/2017 CLINICAL DATA:  Facial droop and left-sided weakness with slurred speech EXAM: CT HEAD WITHOUT CONTRAST TECHNIQUE: Contiguous axial images were obtained from the base of the skull through the vertex without intravenous contrast. COMPARISON:  MRI 08/12/2014, CT brain 08/19/2017 FINDINGS: Brain: No  acute territorial infarction, hemorrhage or new intracranial mass is visualized. Re- demonstrated partially calcified right parasellar mass measuring 2.4 x 2.2 cm, previously characterized as meningioma. Mild small vessel ischemic changes of the white matter. Mild atrophy. Stable ventricle size Vascular: No hyperdense vessels.  Carotid artery calcification. Skull: Normal. Negative for fracture or focal lesion. Sinuses/Orbits: Mild mucosal thickening in the sphenoid and ethmoid sinuses. No acute orbital abnormality Other: None IMPRESSION: 1. No CT evidence for acute intracranial  abnormality. 2. Stable 2.4 cm partially calcified right parasellar mass consistent with history of meningioma. 3. Atrophy and mild small vessel ischemic changes of the white matter Electronically Signed   By: Donavan Foil M.D.   On: 09/18/2017 19:23   Ct Angio Neck W Or Wo Contrast  Result Date: 09/19/2017 CLINICAL DATA:  Right frontal lobe infarct.  TIA symptoms. EXAM: CT ANGIOGRAPHY HEAD AND NECK TECHNIQUE: Multidetector CT imaging of the head and neck was performed using the standard protocol during bolus administration of intravenous contrast. Multiplanar CT image reconstructions and MIPs were obtained to evaluate the vascular anatomy. Carotid stenosis measurements (when applicable) are obtained utilizing NASCET criteria, using the distal internal carotid diameter as the denominator. CONTRAST:  72mL ISOVUE-370 IOPAMIDOL (ISOVUE-370) INJECTION 76% COMPARISON:  MRI brain from the same day. CT head without contrast 09/18/2017 FINDINGS: CT HEAD FINDINGS Brain: The anterior right insular and operculum infarct is now evident. No other infarcts are present. The basal ganglia are intact. There is enhancement of the meningioma. Vascular: Vascular calcifications are noted. Skull: Calvarium is intact. Sinuses: The paranasal sinuses and mastoid air cells are clear. Orbits: Bilateral lens replacements are present. Globes and orbits are otherwise normal. Review of the MIP images confirms the above findings CTA NECK FINDINGS Aortic arch: A 3 vessel arch configuration is present. Atherosclerotic calcifications are present at the aorta. There is a small ulceration distal to the left subclavian artery. No aneurysm is present. Right carotid system: There is marked tortuosity of the right common carotid artery without a significant stenosis. Dense calcifications are present at the right carotid bifurcation. The minimal luminal diameter is 3 mm. This compares with a more normal distal vessel size of 5 mm. There is mild  tortuosity of the cervical right ICA without significant stenosis. Left carotid system: The left common carotid artery is within normal limits. The bifurcation demonstrates calcified and noncalcified plaque. There is thrombus extending into the lumen. The left internal carotid artery is occluded 1.5 cm above the bifurcation without reconstitution in the neck. Vertebral arteries: Atherosclerotic calcifications are present at the origin of the left subclavian artery without significant stenosis. There is a dense calcification at the origin of the right vertebral artery without significant stenosis. Both vertebral arteries are tortuous proximally. There is no significant stenosis or occlusion of either vertebral artery in the neck. Skeleton: Grade 1 anterolisthesis present at C5-6, measuring 4 mm. Multilevel disc disease is present. No focal lytic or blastic lesions are present. Other neck: The thyroid gland is heterogeneous without a dominant lesion. The gland is enlarged. No significant adenopathy is present. Salivary glands are within normal limits. Upper chest: Bilateral pleural effusions are present. There is dependent atelectasis. Micronodular disease in the lung apices bilaterally is concerning for infection. Review of the MIP images confirms the above findings CTA HEAD FINDINGS Anterior circulation: Dense atherosclerotic calcifications are present throughout the cavernous right internal carotid artery without a significant stenosis. The left internal carotid artery is reconstituted at the level of the ophthalmic. The terminal  right ICA is deviated medially by the meningioma. The vessel then stretches anteriorly around the meningioma. The M1 segment is intact. Left A1 and M1 segments are intact as well. The anterior communicating artery is patent. There is a high-grade stenosis or proximal occlusion of the anterior right M2 branch vessel. Posterior left M2 branch vessels demonstrate some regularity without focal  stenosis or occlusion. There is distal branch vessel irregularity in the left MCA branch vessels is well. Posterior circulation: The vertebral arteries are codominant. PICA origins are visualized and normal. The basilar artery is tortuous somewhat small. Fetal type posterior cerebral arteries are present bilaterally. Small P1 segments are noted. The PCA branch vessels demonstrate distal segmental narrowing without a significant proximal stenosis or occlusion. Venous sinuses: The dural sinuses are patent. The straight sinus and deep cerebral veins are intact. Cortical veins are unremarkable. Anatomic variants: Fetal type posterior cerebral artery is bilaterally. Delayed phase: The postcontrast images demonstrate avid enhancement of the right sphenoid wing meningioma. The right insular cortex and operculum nonhemorrhagic infarct is well-defined on the postcontrast images. No other pathologic enhancement is present. Review of the MIP images confirms the above findings IMPRESSION: 1. Proximal anterior right M2 segment occlusion associated with the acute/subacute nonhemorrhagic infarct of the anterior right insular cortex and right frontal operculum. 2. 2.7 cm sphenoid wing meningioma displaces the right ICA terminus and M1 segment without occlusion. 3. Left internal carotid artery occlusion just beyond the bifurcation with reconstitution at the ophthalmic segment. 4. Diffuse distal small vessel disease without MCA and PCA branch vessels bilaterally. 5. Dense atherosclerotic calcifications at the right carotid bifurcation without significant stenosis. 6. Dense atherosclerotic calcifications at the origin of the right vertebral artery proximally and within the right common carotid carotid artery without significant stenosis. 7.  Aortic Atherosclerosis (ICD10-I70.0). 8. Bilateral pleural effusions. 9. Micronodular airspace disease the lung apices, right greater than left. This is concerning for atypical infection.  Electronically Signed   By: San Morelle M.D.   On: 09/19/2017 09:14   Mr Brain Wo Contrast  Result Date: 09/19/2017 CLINICAL DATA:  Focal neuro deficits greater than 6 hours. New onset left upper and lower extremity weakness and facial droop. EXAM: MRI HEAD WITHOUT CONTRAST TECHNIQUE: Multiplanar, multiecho pulse sequences of the brain and surrounding structures were obtained without intravenous contrast. COMPARISON:  CTA head and neck from the same day. CT head without contrast 09/18/2017. MRI brain 08/12/2014 FINDINGS: Brain: Diffusion-weighted images demonstrate acute/ subacute nonhemorrhagic infarct involving the anterior right insular ribbon and right frontal operculum. The precentral gyrus is not involved. Left hemisphere demonstrates no acute abnormality. A right sphenoid wing meningioma demonstrates some interval growth, now measuring 2.3 x 2.3 x 1.7 cm. This compares with 2.2 x 2.2 x 1.6 cm on the previous study. Periventricular and subcortical T2 changes bilaterally are stable. Ventricles proportionate to the degree of atrophy. No significant extra-axial fluid collection is present. T2 changes are associated with the area of acute/subacute infarct. Vascular: The cavernous left internal carotid artery is occluded. Flow is present in the cavernous right internal carotid artery. Flow is present in the vertebral arteries and basilar artery. Skull and upper cervical spine: The skullbase is otherwise within normal limits. The craniocervical junction is normal. Sinuses/Orbits: Mild ethmoid mucosal thickening is present. The paranasal sinuses are otherwise clear. Asymmetric right sphenoid mucosal thickening is present. There is some fluid in the inferior mastoid air cells bilaterally. No obstructing nasopharyngeal lesion is present. Bilateral lens replacements are present. The globes and orbits  are otherwise within normal. IMPRESSION: 1. Acute/subacute nonhemorrhagic infarct involving the anterior  right insular ribbon and right frontal operculum. 2. Slight interval growth of right sphenoid wing meningioma with local mass effect. 3. Chronic occlusion of the left internal carotid artery below the ophthalmic segment. 4. Stable atrophy and white matter disease bilaterally. Electronically Signed   By: San Morelle M.D.   On: 09/19/2017 10:03   Dg Shoulder Right Port  Result Date: 09/19/2017 CLINICAL DATA:  Chronic right arm pain EXAM: PORTABLE RIGHT SHOULDER COMPARISON:  09/18/2017, 03/13/2010 FINDINGS: There is complete resorption of the proximal portion of the right humerus. Some calcific changes noted along the superolateral aspect of the joint capsule are noted. Increased lucency is noted likely related to large joint effusion. Some erosive changes of the glenoid are noted as well. The underlying bony thorax is within normal limits. IMPRESSION: Chronic appearing changes of the right shoulder joint with resorption of the humeral head and portions of the glenoid with known large joint effusion. This is likely related to a chronic inflammatory change. The need for further evaluation can be determined on a clinical basis. Electronically Signed   By: Inez Catalina M.D.   On: 09/19/2017 18:03    Microbiology: No results found for this or any previous visit (from the past 240 hour(s)).   Labs: Basic Metabolic Panel: Recent Labs  Lab 09/18/17 1921 09/18/17 1937  NA 140 141  K 4.2 4.3  CL 103 103  CO2 29  --   GLUCOSE 96 92  BUN 16 19  CREATININE 0.92 1.00  CALCIUM 9.6  --    Liver Function Tests: Recent Labs  Lab 09/18/17 1921  AST 23  ALT 17  ALKPHOS 145*  BILITOT 0.8  PROT 6.7  ALBUMIN 3.7   No results for input(s): LIPASE, AMYLASE in the last 168 hours. No results for input(s): AMMONIA in the last 168 hours. CBC: Recent Labs  Lab 09/18/17 1921 09/18/17 1937 09/19/17 0408  WBC 6.6  --  7.1  NEUTROABS 4.4  --   --   HGB 12.7 12.6 11.3*  HCT 39.3 37.0 35.3*  MCV  97.8  --  97.8  PLT 254  --  249   Cardiac Enzymes: No results for input(s): CKTOTAL, CKMB, CKMBINDEX, TROPONINI in the last 168 hours. BNP: BNP (last 3 results) No results for input(s): BNP in the last 8760 hours.  ProBNP (last 3 results) No results for input(s): PROBNP in the last 8760 hours.  CBG: No results for input(s): GLUCAP in the last 168 hours.     Signed:  Nita Sells MD   [facility] 09/20/2017, 11:00 AM

## 2017-09-20 NOTE — Care Management Note (Addendum)
Case Management Note  Patient Details  Name: [name] MRN: [id number] Date of Birth: 11/22/1922  Subjective/Objective:                 Spoke w patient's daughter on the phone. They would like to use [facility]. They decline need for DME at [facility] stating she has RW and accessable bathroom at home. No other CM needs identified at this time.  Updated referral to add SLP   Action/Plan:   Expected Discharge Date:  09/20/17               Expected Discharge Plan:  [place]  In-House Referral:     Discharge planning Services  CM Consult  Post Acute Care Choice:  Home Health Choice offered to:  Adult Children  DME Arranged:    DME Agency:     HH Arranged:  RN, PT, Nurse's Aide [place] Agency:  [facility]  Status of Service:  Completed, signed off  If discussed at [place] [facility] of Stay Meetings, dates discussed:    Additional Comments:  Carles Collet, RN 09/20/2017, 2:30 PM

## 2017-09-20 NOTE — Evaluation (Signed)
Speech Language Pathology Evaluation Patient Details Name: [name] MRN: [id number] DOB: April 21, 1923 Today's Date: 09/20/2017 Time: 4270-6237 SLP Time Calculation (min) (ACUTE ONLY): 15 min  Problem List:  Patient Active Problem List   Diagnosis Date Noted  . Essential hypertension 09/19/2017  . Cerebrovascular accident (CVA) due to occlusion of right middle cerebral artery ([place])   . Carotid occlusion, left   . Stenosis of right carotid artery   . Hyperlipidemia   . Meningioma ([place])   . Intracranial vascular stenosis 09/18/2017  . Abdominal wall hernia-repair with physiomesh 12/05/2011   Past Medical History:  Past Medical History:  Diagnosis Date  . Anemia   . Arthritis   . History of umbilical hernia repair   . Hypertension    Past Surgical History:  Past Surgical History:  Procedure Laterality Date  . APPENDECTOMY    . HERNIA REPAIR    . TOTAL KNEE ARTHROPLASTY     right  . TOTAL KNEE ARTHROPLASTY     left  . UMBILICAL HERNIA REPAIR     HPI:  [name] is a 83 y.o. female with history of hypertension, possibly CHF, hypothyroidism and meningioma was brought to the ER after patient was found to have left-sided weakness. CT negative for acute changes, showed meningioma.  PMH positive for anemia, arthritis and HTN.  MRI positive for Acute/subacute nonhemorrhagic infarct involving the anterior right insular ribbon and right frontal operculum.   Assessment / Plan / Recommendation Clinical Impression  Patient presents with mild cognitive impairment with deficits seen in delayed recall, problem solving, and attention. Pt with decreased safety awareness as well. Pt scored 19/30 on MMSE, in low range of mild cognitive impairment. Per her daughter, pt was living alone prior, managing her own medications. Daughter assisted with meals. She has noticed changes in pt's short term memory this hospitalization. Recommend HH SLP and 24 hour supervision/assistance for safe  transition to home. Case management paged with recommendation as d/c is imminent.     SLP Assessment  SLP Recommendation/Assessment: All further Speech Lanaguage Pathology  needs can be addressed in the next venue of care SLP Visit Diagnosis: Cognitive communication deficit (R41.841)    Follow Up Recommendations  Home health SLP    Frequency and Duration           SLP Evaluation Cognition  Overall Cognitive Status: Impaired/Different from baseline(daughter present and reports new short term memory issues) Arousal/Alertness: Awake/alert Orientation Level: Oriented to person;Oriented to place;Oriented to situation Attention: Focused;Sustained Focused Attention: Appears intact Sustained Attention: Impaired Sustained Attention Impairment: Verbal basic;Functional basic Memory: Impaired Memory Impairment: Storage deficit;Decreased recall of new information;Decreased short term memory Decreased Short Term Memory: Verbal basic;Functional basic Awareness: Impaired Awareness Impairment: Intellectual impairment Problem Solving: Impaired Problem Solving Impairment: Verbal complex Safety/Judgment: Impaired       Comprehension  Auditory Comprehension Overall Auditory Comprehension: Appears within functional limits for tasks assessed Yes/No Questions: Within Functional Limits Visual Recognition/Discrimination Discrimination: Within Function Limits Reading Comprehension Reading Status: Not tested    Expression Expression Primary Mode of Expression: Verbal Verbal Expression Overall Verbal Expression: Appears within functional limits for tasks assessed Initiation: No impairment Automatic Speech: Name;Social Response Level of Generative/Spontaneous Verbalization: Sentence Repetition: No impairment Naming: No impairment Pragmatics: Impairment Impairments: Eye contact Interfering Components: (daughter reports pt keeps her eyes closed frequently) Non-Verbal Means of Communication: Not  applicable Written Expression Dominant Hand: Right Written Expression: Within Functional Limits(sentence level)   Oral / Motor  Oral Motor/Sensory Function Overall Oral Motor/Sensory  Function: Within functional limits Motor Speech Overall Motor Speech: Impaired at baseline(mild "slurring" at baseline per daughter)   GO          Functional Assessment Tool Used: skilled clinical judgment Functional Limitations: Memory Memory Current Status [phone]): At least 20 percent but less than 40 percent impaired, limited or restricted Memory Goal Status ([id number]): At least 20 percent but less than 40 percent impaired, limited or restricted Memory Discharge Status [phone]): At least 20 percent but less than 40 percent impaired, limited or restricted         [name] 09/20/2017, 4:42 PM  Deneise Lever, [place], [place] Speech-Language Pathologist [phone]

## 2017-09-21 DIAGNOSIS — Z96653 Presence of artificial knee joint, bilateral: Secondary | ICD-10-CM | POA: Diagnosis present

## 2017-09-21 DIAGNOSIS — G459 Transient cerebral ischemic attack, unspecified: Secondary | ICD-10-CM | POA: Diagnosis present

## 2017-09-21 DIAGNOSIS — K5909 Other constipation: Secondary | ICD-10-CM | POA: Diagnosis present

## 2017-09-21 DIAGNOSIS — I6522 Occlusion and stenosis of left carotid artery: Secondary | ICD-10-CM | POA: Diagnosis not present

## 2017-09-21 DIAGNOSIS — K922 Gastrointestinal hemorrhage, unspecified: Secondary | ICD-10-CM | POA: Diagnosis not present

## 2017-09-21 DIAGNOSIS — I6523 Occlusion and stenosis of bilateral carotid arteries: Secondary | ICD-10-CM | POA: Diagnosis present

## 2017-09-21 DIAGNOSIS — I63511 Cerebral infarction due to unspecified occlusion or stenosis of right middle cerebral artery: Secondary | ICD-10-CM | POA: Diagnosis not present

## 2017-09-21 DIAGNOSIS — D509 Iron deficiency anemia, unspecified: Secondary | ICD-10-CM | POA: Diagnosis present

## 2017-09-21 DIAGNOSIS — K920 Hematemesis: Secondary | ICD-10-CM | POA: Diagnosis present

## 2017-09-21 DIAGNOSIS — Z515 Encounter for palliative care: Secondary | ICD-10-CM | POA: Diagnosis present

## 2017-09-21 DIAGNOSIS — I639 Cerebral infarction, unspecified: Secondary | ICD-10-CM | POA: Diagnosis not present

## 2017-09-21 DIAGNOSIS — I679 Cerebrovascular disease, unspecified: Secondary | ICD-10-CM | POA: Diagnosis not present

## 2017-09-21 DIAGNOSIS — D329 Benign neoplasm of meninges, unspecified: Secondary | ICD-10-CM | POA: Diagnosis present

## 2017-09-21 DIAGNOSIS — I10 Essential (primary) hypertension: Secondary | ICD-10-CM | POA: Diagnosis not present

## 2017-09-21 DIAGNOSIS — I63411 Cerebral infarction due to embolism of right middle cerebral artery: Secondary | ICD-10-CM | POA: Diagnosis present

## 2017-09-21 DIAGNOSIS — I110 Hypertensive heart disease with heart failure: Secondary | ICD-10-CM | POA: Diagnosis present

## 2017-09-21 DIAGNOSIS — E039 Hypothyroidism, unspecified: Secondary | ICD-10-CM | POA: Diagnosis present

## 2017-09-21 DIAGNOSIS — R54 Age-related physical debility: Secondary | ICD-10-CM | POA: Diagnosis present

## 2017-09-21 DIAGNOSIS — I6521 Occlusion and stenosis of right carotid artery: Secondary | ICD-10-CM | POA: Diagnosis not present

## 2017-09-21 DIAGNOSIS — Y92230 Patient room in hospital as the place of occurrence of the external cause: Secondary | ICD-10-CM | POA: Diagnosis not present

## 2017-09-21 DIAGNOSIS — R1312 Dysphagia, oropharyngeal phase: Secondary | ICD-10-CM | POA: Diagnosis present

## 2017-09-21 DIAGNOSIS — Z66 Do not resuscitate: Secondary | ICD-10-CM | POA: Diagnosis present

## 2017-09-21 DIAGNOSIS — Y848 Other medical procedures as the cause of abnormal reaction of the patient, or of later complication, without mention of misadventure at the time of the procedure: Secondary | ICD-10-CM | POA: Diagnosis not present

## 2017-09-21 DIAGNOSIS — Z79890 Hormone replacement therapy (postmenopausal): Secondary | ICD-10-CM | POA: Diagnosis not present

## 2017-09-21 DIAGNOSIS — E119 Type 2 diabetes mellitus without complications: Secondary | ICD-10-CM | POA: Diagnosis present

## 2017-09-21 DIAGNOSIS — T8089XA Other complications following infusion, transfusion and therapeutic injection, initial encounter: Secondary | ICD-10-CM | POA: Diagnosis not present

## 2017-09-21 DIAGNOSIS — D62 Acute posthemorrhagic anemia: Secondary | ICD-10-CM | POA: Diagnosis present

## 2017-09-21 DIAGNOSIS — Z8249 Family history of ischemic heart disease and other diseases of the circulatory system: Secondary | ICD-10-CM | POA: Diagnosis not present

## 2017-09-21 DIAGNOSIS — I5033 Acute on chronic diastolic (congestive) heart failure: Secondary | ICD-10-CM | POA: Diagnosis present

## 2017-09-21 DIAGNOSIS — G9341 Metabolic encephalopathy: Secondary | ICD-10-CM | POA: Diagnosis present

## 2017-09-21 DIAGNOSIS — R001 Bradycardia, unspecified: Secondary | ICD-10-CM | POA: Diagnosis present

## 2017-09-21 DIAGNOSIS — E785 Hyperlipidemia, unspecified: Secondary | ICD-10-CM | POA: Diagnosis present

## 2017-09-21 DIAGNOSIS — F329 Major depressive disorder, single episode, unspecified: Secondary | ICD-10-CM | POA: Diagnosis present

## 2017-09-21 DIAGNOSIS — I672 Cerebral atherosclerosis: Secondary | ICD-10-CM | POA: Diagnosis present

## 2017-09-21 LAB — CBC WITH DIFFERENTIAL/PLATELET
Basophils Absolute: 0 10*3/uL (ref 0.0–0.1)
Basophils Relative: 0 %
Eosinophils Absolute: 0 10*3/uL (ref 0.0–0.7)
Eosinophils Relative: 0 %
HEMATOCRIT: 30.2 % — AB (ref 36.0–46.0)
HEMOGLOBIN: 10.1 g/dL — AB (ref 12.0–15.0)
LYMPHS ABS: 1.3 10*3/uL (ref 0.7–4.0)
LYMPHS PCT: 9 %
MCH: 31.8 pg (ref 26.0–34.0)
MCHC: 33.4 g/dL (ref 30.0–36.0)
MCV: 95 fL (ref 78.0–100.0)
MONOS PCT: 14 %
Monocytes Absolute: 2 10*3/uL — ABNORMAL HIGH (ref 0.1–1.0)
NEUTROS ABS: 10.8 10*3/uL — AB (ref 1.7–7.7)
NEUTROS PCT: 77 %
Platelets: 307 10*3/uL (ref 150–400)
RBC: 3.18 MIL/uL — ABNORMAL LOW (ref 3.87–5.11)
RDW: 13.4 % (ref 11.5–15.5)
WBC: 14.1 10*3/uL — ABNORMAL HIGH (ref 4.0–10.5)

## 2017-09-21 LAB — BASIC METABOLIC PANEL
Anion gap: 11 (ref 5–15)
BUN: 55 mg/dL — AB (ref 6–20)
CHLORIDE: 96 mmol/L — AB (ref 101–111)
CO2: 28 mmol/L (ref 22–32)
CREATININE: 1.03 mg/dL — AB (ref 0.44–1.00)
Calcium: 10.9 mg/dL — ABNORMAL HIGH (ref 8.9–10.3)
GFR calc Af Amer: 53 mL/min — ABNORMAL LOW (ref >60)
GFR calc non Af Amer: 46 mL/min — ABNORMAL LOW (ref >60)
Glucose, Bld: 170 mg/dL — ABNORMAL HIGH (ref 65–99)
Potassium: 3.7 mmol/L (ref 3.5–5.1)
Sodium: 135 mmol/L (ref 135–145)

## 2017-09-21 MED ORDER — SODIUM CHLORIDE 0.9 % IV SOLN
8.0000 mg/h | INTRAVENOUS | Status: DC
  Administered 2017-09-21 – 2017-09-23 (×4): 8 mg/h via INTRAVENOUS
  Filled 2017-09-21 (×10): qty 80

## 2017-09-21 MED ORDER — PANTOPRAZOLE SODIUM 40 MG IV SOLR
80.0000 mg | Freq: Once | INTRAVENOUS | Status: AC
  Administered 2017-09-21: 12:00:00 80 mg via INTRAVENOUS
  Filled 2017-09-21: qty 80

## 2017-09-21 MED ORDER — SODIUM CHLORIDE 0.9 % IV SOLN
INTRAVENOUS | Status: DC
  Administered 2017-09-21 – 2017-09-22 (×2): via INTRAVENOUS

## 2017-09-21 MED ORDER — PANTOPRAZOLE SODIUM 40 MG IV SOLR
40.0000 mg | Freq: Two times a day (BID) | INTRAVENOUS | Status: DC
  Administered 2017-09-24 – 2017-09-25 (×2): 40 mg via INTRAVENOUS
  Filled 2017-09-21 (×3): qty 40

## 2017-09-21 NOTE — Progress Notes (Signed)
PROGRESS NOTE  [name]:[id number] DOB: [birth year] DOA: 09/18/2017 PCP: Lawerance Cruel, MD  HPI/Recap of past 24 hours: HPI from Dr Hal Hope on 09/19/17 [name] is a 83 y.o. female with history of hypertension, possibly CHF, hypothyroidism and meningioma was brought to the ER after patient was found to have left-sided weakness.  As per the patient's daughter, was found to have slumped on her chair not moving her left upper and lower extremities.  Also mild left facial droop was seen. Patient did not lose consciousness or did not have any seizure-like activities or any incontinence of urine or tongue bite.  Patient symptoms lasted for less than 3 minutes following which patient came back to baseline. In the ER patient was appearing nonfocal. CT head was showing possible meningioma but otherwise nothing acute.  EKG was showing sinus bradycardia.  Neurologist on-call was consulted and patient admitted for further management.  Overnight, pt was noted to have coffee-ground emesis with nausea which persisted till this am. Pt also reported some mild epigastric tenderness. Pt denies any chest pain, SOB, D/C, fever/chills. Pt was planned to be discharged on 09/20/17 but stopped due to possible GIB  Assessment/Plan: Active Problems:   Intracranial vascular stenosis   Essential hypertension   Cerebrovascular accident (CVA) due to occlusion of right middle cerebral artery ([place])   Carotid occlusion, left   Stenosis of right carotid artery   Hyperlipidemia   Meningioma ([facility])   CVA (cerebral vascular accident) ([place])  #Right MCA acute infarct Pt currently stable MRI showed right MCA moderate-sized infarct CTA head neck showed left ICA chronic occlusion, right M2 occlusion corresponding to right MCA infarct CT head: Stable 2.4 cm partially calcified right parasellar mass consistent with history of meningioma  ECHO: LVEF 65-70%, moderate LVH, normal wall motion, mild calcific aortic  valve stenosis, mild MR, severe LAE, trivial TR, RVSP 4mmHg, normal IVC A1c 5.1, LDL 75 Neurology consulted: Cardiac event monitor as outpt to r/o Afib, Continue DAPT for 3 months and then plavix alone (ASA 325mg  + Plavix 75mg ) Antiplatelet therapy held due to ??GIB PT/OT/SLP: Home health PT/OT/SLP Monitor closely  #??Upper GIB +coffee ground emesis, elevated BUN, drop in hgb GI consulted- Plan for EGD tomorrow am Held DAPT, lovenox due to possible GIB IV protonix IVF Daily cbc  #Hypertension Stable, allow for permissive hypertension Held PTA lasix, coreg, lisinopril  #Hypothyroidism TSH 3.515 Continue Synthroid  #History of iron deficiency anemia Continue iron supplements  #Depression Continue antidepressant   Code Status: Full  Family Communication: None at bedside  Disposition Plan: Home once stable   Consultants:  Neurology  GI  Procedures:  None  Antimicrobials:  None  DVT prophylaxis:  SCDs   Objective: Vitals:   09/21/17 0614 09/21/17 0854 09/21/17 1357 09/21/17 1745  BP: (!) 154/76 (!) 122/58 92/65 (!) 147/55  Pulse: 89 95 89 79  Resp: 19 20 16 20   Temp: 98.9 F (37.2 C) 98.6 F (37 C) (!) 97.5 F (36.4 C)   TempSrc: Oral Oral Axillary   SpO2: 92% 95% 100% 97%  Weight:      Height:        Intake/Output Summary (Last 24 hours) at 09/21/2017 1920 Last data filed at 09/21/2017 1345 Gross per 24 hour  Intake 120 ml  Output -  Net 120 ml   Filed Weights   09/18/17 1813 09/18/17 2250  Weight: 55.3 kg (122 lb) 53.4 kg (117 lb 11.6 oz)    Exam:  General: Alert, awake, oriented, not in acute distress  Cardiovascular: S1-S2 present, no added heart sounds  Respiratory: Chest clear bilaterally  Abdomen: Epigastric tenderness, soft, nondistended, bowel sounds present  Musculoskeletal: No pedal edema bilaterally  Skin: Normal  Psychiatry: Normal mood   Data Reviewed: CBC: Recent Labs  Lab 09/18/17 1921 09/18/17 1937  09/19/17 0408 09/20/17 1824 09/21/17 0917  WBC 6.6  --  7.1 13.7* 14.1*  NEUTROABS 4.4  --   --  11.5* 10.8*  HGB 12.7 12.6 11.3* 11.7* 10.1*  HCT 39.3 37.0 35.3* 36.2 30.2*  MCV 97.8  --  97.8 96.8 95.0  PLT 254  --  249 310 355   Basic Metabolic Panel: Recent Labs  Lab 09/18/17 1921 09/18/17 1937 09/21/17 0917  NA 140 141 135  K 4.2 4.3 3.7  CL 103 103 96*  CO2 29  --  28  GLUCOSE 96 92 170*  BUN 16 19 55*  CREATININE 0.92 1.00 1.03*  CALCIUM 9.6  --  10.9*   GFR: Estimated Creatinine Clearance: 26 mL/min (A) (by C-G formula based on SCr of 1.03 mg/dL (H)). Liver Function Tests: Recent Labs  Lab 09/18/17 1921  AST 23  ALT 17  ALKPHOS 145*  BILITOT 0.8  PROT 6.7  ALBUMIN 3.7   No results for input(s): LIPASE, AMYLASE in the last 168 hours. No results for input(s): AMMONIA in the last 168 hours. Coagulation Profile: Recent Labs  Lab 09/18/17 1921  INR 1.08   Cardiac Enzymes: No results for input(s): CKTOTAL, CKMB, CKMBINDEX, TROPONINI in the last 168 hours. BNP (last 3 results) No results for input(s): PROBNP in the last 8760 hours. HbA1C: Recent Labs    09/19/17 0408  HGBA1C 5.1   CBG: No results for input(s): GLUCAP in the last 168 hours. Lipid Profile: Recent Labs    09/19/17 0408  CHOL 151  HDL 64  LDLCALC 75  TRIG 62  CHOLHDL 2.4   Thyroid Function Tests: Recent Labs    09/19/17 0558  TSH 3.515   Anemia Panel: No results for input(s): VITAMINB12, FOLATE, FERRITIN, TIBC, IRON, RETICCTPCT in the last 72 hours. Urine analysis:    Component Value Date/Time   COLORURINE STRAW (A) 09/18/2017 1824   APPEARANCEUR CLEAR 09/18/2017 1824   LABSPEC 1.006 09/18/2017 1824   PHURINE 7.0 09/18/2017 1824   GLUCOSEU NEGATIVE 09/18/2017 1824   HGBUR NEGATIVE 09/18/2017 1824   BILIRUBINUR NEGATIVE 09/18/2017 1824   KETONESUR NEGATIVE 09/18/2017 1824   PROTEINUR NEGATIVE 09/18/2017 1824   UROBILINOGEN 0.2 08/30/2010 1430   NITRITE NEGATIVE  09/18/2017 1824   LEUKOCYTESUR NEGATIVE 09/18/2017 1824   Sepsis Labs: @LABRCNTIP (procalcitonin:4,lacticidven:4)  )No results found for this or any previous visit (from the past 240 hour(s)).    Studies: No results found.  Scheduled Meds: . ALPRAZolam  0.5 mg Oral QHS  . buPROPion  150 mg Oral BID  . carvedilol  25 mg Oral BID WC  . ferrous sulfate  325 mg Oral BID WC  . FLUoxetine  40 mg Oral Daily  . furosemide  80 mg Oral q morning - 10a  . levothyroxine  50 mcg Oral QAC breakfast  . lisinopril  40 mg Oral Daily  . multivitamin with minerals  1 tablet Oral Daily  . [START ON 09/24/2017] pantoprazole  40 mg Intravenous Q12H  . potassium chloride SA  40 mEq Oral q morning - 10a  . pravastatin  20 mg Oral q1800  . timolol  1 drop Both Eyes BID  Continuous Infusions: . pantoprozole (PROTONIX) infusion 8 mg/hr (09/21/17 1243)     LOS: 0 days     Alma Friendly, MD [facility]   If 7PM-7AM, please contact night-coverage [email or web address] Password [email or web address] 09/21/2017, 7:20 PM

## 2017-09-21 NOTE — H&P (View-Only) (Signed)
[facility] Reason for consult: GI bleeding Referring Physician: [facility].  PCP: Dr. Melinda Crutch.  Primary GI: Dr. Arta Silence.  [name] is an 83 y.o. female.  HPI: Patient was admitted with likely stroke.  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  She was followed and felt to be back to baseline.  30-day cardiac monitoring was recommended as well as treatment with aspirin and clopidogrel.  The patient was actually discharged but continued to be nauseated and vomited up coffee-ground material with drop in hemoglobin from 11.3-10.1.  Patient's daughter gives most of the history. The patient has had problems with diarrhea and constipation and has seen Dr. Paulita Fujita for this.  She has been treated with MiraLAX and Benefiber without much improvement.  According to the daughter she has had colonoscopy.  We have a record of colonoscopy in 2003 by Dr. Teena Irani showing cecal ulceration that apparently were benign felt to be due to NSAIDs.  EGD done by Dr. Amedeo Plenty at that time showed large hiatal hernia and antral gastritis.  Feeling at that time was that all of this was due to NSAIDs.  According to the daughter she has not had any further endoscopic evaluation.  I also denied the use of any NSAIDs.  The daughter buys all of her medicine for her and is very adamant that she does not take NSAIDs.  There is been no complaints of melena or hematochezia.  The patient has for some time been complaining of vague upper symptoms.  According to the notes from her PCP Dr. Harrington Challenger and Dr. Paulita Fujita she has been taking chronic iron therapy, MiraLAX, and Nexium.  The daughter confirms all of this.  Past Medical History:  Diagnosis Date  . Anemia   . Arthritis   . History of umbilical hernia repair   . Hypertension     Past Surgical History:  Procedure Laterality Date  . APPENDECTOMY    .  HERNIA REPAIR    . TOTAL KNEE ARTHROPLASTY     right  . TOTAL KNEE ARTHROPLASTY     left  . UMBILICAL HERNIA REPAIR      Family History  Problem Relation Age of Onset  . Heart disease Father     Social History:  reports that  has never smoked. she has never used smokeless tobacco. She reports that she does not drink alcohol or use drugs.  Allergies: No Known Allergies  Medications; Prior to Admission medications   Medication Sig Start Date End Date Taking? Authorizing Provider  ALPRAZolam Duanne Moron) 0.5 MG tablet Take 0.5 mg by mouth at bedtime.    Yes [provider]  aspirin 81 MG tablet Take 81 mg by mouth daily.   Yes [provider]  buPROPion (WELLBUTRIN SR) 150 MG 12 hr tablet Take 150 mg by mouth 2 (two) times daily.   Yes [provider]  carvedilol (COREG) 25 MG tablet Take 25 mg by mouth 2 (two) times daily with a meal.   Yes [provider]  Cholecalciferol (VITAMIN D-3) 1000 units CAPS Take 1,000-2,000 Units by mouth 2 (two) times daily.    Yes [provider]  esomeprazole (NEXIUM) 40 MG capsule Take 40 mg by mouth daily at 12 noon.   Yes [provider]  ferrous sulfate 325 (65 FE) MG tablet Take 325 mg by mouth 2 (two) times daily with a  meal.    Yes [provider]  FLUoxetine (PROZAC) 40 MG capsule Take 40 mg by mouth daily.   Yes [provider]  furosemide (LASIX) 40 MG tablet Take 80 mg by mouth every morning.    Yes [provider]  levothyroxine (SYNTHROID, LEVOTHROID) 50 MCG tablet Take 50 mcg by mouth daily before breakfast.   Yes [provider]  lisinopril (PRINIVIL,ZESTRIL) 40 MG tablet Take 40 mg by mouth daily.   Yes [provider]  Multiple Vitamins-Calcium (ONE-A-DAY WOMENS FORMULA) TABS Take 1 tablet by mouth daily.   Yes [provider]  potassium chloride SA (K-DUR,KLOR-CON) 20 MEQ tablet Take 40 mEq by mouth every morning.   Yes [provider]  timolol (TIMOPTIC) 0.5 % ophthalmic solution Place 1 drop into both eyes 2 (two) times daily.   Yes [provider]  clopidogrel (PLAVIX) 75 MG tablet Take 1 tablet (75 mg total) by mouth daily. 09/21/17   Nita Sells, MD   . ALPRAZolam  0.5 mg Oral QHS  . buPROPion  150 mg Oral BID  . carvedilol  25 mg Oral BID WC  . ferrous sulfate  325 mg Oral BID WC  . FLUoxetine  40 mg Oral Daily  . furosemide  80 mg Oral q morning - 10a  . levothyroxine  50 mcg Oral QAC breakfast  . lisinopril  40 mg Oral Daily  . multivitamin with minerals  1 tablet Oral Daily  . [START ON 09/24/2017] pantoprazole  40 mg Intravenous Q12H  . potassium chloride SA  40 mEq Oral q morning - 10a  . pravastatin  20 mg Oral q1800  . timolol  1 drop Both Eyes BID   PRN Meds acetaminophen **OR** acetaminophen (TYLENOL) oral liquid 160 mg/5 mL **OR** acetaminophen, ondansetron Results for orders placed or performed during the hospital encounter of 09/18/17 (from the past 48 hour(s))  CBC with Differential/Platelet     Status: Abnormal   Collection Time: 09/20/17  6:24 PM  Result Value Ref Range   WBC 13.7 (H) 4.0 - 10.5 K/uL   RBC 3.74 (L) 3.87 - 5.11 MIL/uL   Hemoglobin 11.7 (L) 12.0 - 15.0 g/dL   HCT 36.2 36.0 - 46.0 %   MCV 96.8 78.0 - 100.0 fL   MCH 31.3 26.0 - 34.0 pg   MCHC 32.3 30.0 - 36.0 g/dL   RDW 13.4 11.5 - 15.5 %   Platelets 310 150 - 400 K/uL   Neutrophils Relative % 85 %   Neutro Abs 11.5 (H) 1.7 - 7.7 K/uL   Lymphocytes Relative 9 %   Lymphs Abs 1.3 0.7 - 4.0 K/uL   Monocytes Relative 6 %   Monocytes Absolute 0.8 0.1 - 1.0 K/uL   Eosinophils Relative 0 %   Eosinophils Absolute 0.1 0.0 - 0.7 K/uL   Basophils Relative 0 %   Basophils Absolute 0.0 0.0 - 0.1 K/uL  Type and screen [place]     Status: None   Collection Time: 09/20/17  6:24 PM  Result Value Ref Range   ABO/RH(D) O POS    Antibody Screen NEG    Sample Expiration 09/23/2017    CBC with Differential/Platelet     Status: Abnormal   Collection Time: 09/21/17  9:17 AM  Result Value Ref Range   WBC 14.1 (H) 4.0 - 10.5 K/uL   RBC 3.18 (L) 3.87 - 5.11 MIL/uL   Hemoglobin 10.1 (L) 12.0 - 15.0 g/dL   HCT  30.2 (L) 36.0 - 46.0 %   MCV 95.0 78.0 - 100.0 fL   MCH 31.8 26.0 - 34.0 pg   MCHC 33.4 30.0 - 36.0 g/dL   RDW 13.4 11.5 - 15.5 %   Platelets 307 150 - 400 K/uL   Neutrophils Relative % 77 %   Neutro Abs 10.8 (H) 1.7 - 7.7 K/uL   Lymphocytes Relative 9 %   Lymphs Abs 1.3 0.7 - 4.0 K/uL   Monocytes Relative 14 %   Monocytes Absolute 2.0 (H) 0.1 - 1.0 K/uL   Eosinophils Relative 0 %   Eosinophils Absolute 0.0 0.0 - 0.7 K/uL   Basophils Relative 0 %   Basophils Absolute 0.0 0.0 - 0.1 K/uL  Basic metabolic panel     Status: Abnormal   Collection Time: 09/21/17  9:17 AM  Result Value Ref Range   Sodium 135 135 - 145 mmol/L   Potassium 3.7 3.5 - 5.1 mmol/L   Chloride 96 (L) 101 - 111 mmol/L   CO2 28 22 - 32 mmol/L   Glucose, Bld 170 (H) 65 - 99 mg/dL   BUN 55 (H) 6 - 20 mg/dL   Creatinine, Ser 1.03 (H) 0.44 - 1.00 mg/dL   Calcium 10.9 (H) 8.9 - 10.3 mg/dL   GFR calc non Af Amer 46 (L) >60 mL/min   GFR calc Af Amer 53 (L) >60 mL/min    Comment: (NOTE) The eGFR has been calculated using the CKD EPI equation. This calculation has not been validated in all clinical situations. eGFR's persistently <60 mL/min signify possible Chronic Kidney Disease.    Anion gap 11 5 - 15    Dg Shoulder Right Port  Result Date: 09/19/2017 CLINICAL DATA:  Chronic right arm pain EXAM: PORTABLE RIGHT SHOULDER COMPARISON:  09/18/2017, 03/13/2010 FINDINGS: There is complete resorption of the proximal portion of the right humerus. Some calcific changes noted along the superolateral aspect of the joint capsule are noted. Increased lucency is noted likely related to large joint effusion. Some erosive changes of the glenoid are noted as well. The underlying bony thorax is within normal  limits. IMPRESSION: Chronic appearing changes of the right shoulder joint with resorption of the humeral head and portions of the glenoid with known large joint effusion. This is likely related to a chronic inflammatory change. The need for further evaluation can be determined on a clinical basis. Electronically Signed   By: Inez Catalina M.D.   On: 09/19/2017 18:03   ROS: As above            Blood pressure 92/65, pulse 89, temperature (!) 97.5 F (36.4 C), temperature source Axillary, resp. rate 16, height _0  (1.499 m), weight 53.4 kg (117 lb 11.6 oz), SpO2 100 %.  Physical exam:   General--very elderly white female in no distress ENT--cannot get her to open her eyes.  Mouth appears to be grossly normal with no blood in her mouth. Neck--fairly stiff does not move the neck well  heart--regular rate and rhythm without murmurs or gallops Lungs--clear Abdomen--nondistended and soft with mild tenderness in the epigastric area Psych--pleasantly confused   Assessment: 1.  Hematemesis.  This is a bit confusing since the patient has been on Nexium chronically according to her outpatient records.  She will need to be anticoagulated with antiplatelet agents and I think it is important to determine if she is in fact bleeding from an upper GI source such as a gastric tumor etc.  She has had previous EGD.  Discussed repeating EGD with her daughter who is agreeable. 2.  Recent stroke/TIA 3.  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  Diabetes type 2 5.  Chronic constipation 6.  Hematemesis  Plan:. 1.  We will proceed with EGD tomorrow to make certain that there is not a tumor or some other lesion in the stomach that has been causing her hematemesis.  Have discussed this in detail with the daughter who is agreeable.   Nancy Fetter 09/21/2017, 3:56 PM   This note was created using voice recognition software and minor errors may Have occurred  unintentionally. Pager: [phone] If no answer or after hours call [phone]

## 2017-09-21 NOTE — Consult Note (Signed)
[facility] CONSULT Reason for consult: GI bleeding Referring Physician: [facility] hospitalist.  PCP: Dr. Melinda Crutch.  Primary GI: Dr. Arta Silence.  [name] is an 83 y.o. female.  HPI: Patient was admitted with likely stroke.  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  She was followed and felt to be back to baseline.  30-day cardiac monitoring was recommended as well as treatment with aspirin and clopidogrel.  The patient was actually discharged but continued to be nauseated and vomited up coffee-ground material with drop in hemoglobin from 11.3-10.1.  Patient's daughter gives most of the history. The patient has had problems with diarrhea and constipation and has seen Dr. Paulita Fujita for this.  She has been treated with MiraLAX and Benefiber without much improvement.  According to the daughter she has had colonoscopy.  We have a record of colonoscopy in 2003 by Dr. Teena Irani showing cecal ulceration that apparently were benign felt to be due to NSAIDs.  EGD done by Dr. Amedeo Plenty at that time showed large hiatal hernia and antral gastritis.  Feeling at that time was that all of this was due to NSAIDs.  According to the daughter she has not had any further endoscopic evaluation.  I also denied the use of any NSAIDs.  The daughter buys all of her medicine for her and is very adamant that she does not take NSAIDs.  There is been no complaints of melena or hematochezia.  The patient has for some time been complaining of vague upper symptoms.  According to the notes from her PCP Dr. Harrington Challenger and Dr. Paulita Fujita she has been taking chronic iron therapy, MiraLAX, and Nexium.  The daughter confirms all of this.  Past Medical History:  Diagnosis Date  . Anemia   . Arthritis   . History of umbilical hernia repair   . Hypertension     Past Surgical History:  Procedure Laterality Date  . APPENDECTOMY    .  HERNIA REPAIR    . TOTAL KNEE ARTHROPLASTY     right  . TOTAL KNEE ARTHROPLASTY     left  . UMBILICAL HERNIA REPAIR      Family History  Problem Relation Age of Onset  . Heart disease Father     Social History:  reports that  has never smoked. she has never used smokeless tobacco. She reports that she does not drink alcohol or use drugs.  Allergies: No Known Allergies  Medications; Prior to Admission medications   Medication Sig Start Date End Date Taking? Authorizing Provider  ALPRAZolam Duanne Moron) 0.5 MG tablet Take 0.5 mg by mouth at bedtime.    Yes [provider]  aspirin 81 MG tablet Take 81 mg by mouth daily.   Yes [provider]  buPROPion (WELLBUTRIN SR) 150 MG 12 hr tablet Take 150 mg by mouth 2 (two) times daily.   Yes [provider]  carvedilol (COREG) 25 MG tablet Take 25 mg by mouth 2 (two) times daily with a meal.   Yes [provider]  Cholecalciferol (VITAMIN D-3) 1000 units CAPS Take 1,000-2,000 Units by mouth 2 (two) times daily.    Yes [provider]  esomeprazole (NEXIUM) 40 MG capsule Take 40 mg by mouth daily at 12 noon.   Yes [provider]  ferrous sulfate 325 (65 FE) MG tablet Take 325 mg by mouth 2 (two) times daily with a  meal.    Yes [provider]  FLUoxetine (PROZAC) 40 MG capsule Take 40 mg by mouth daily.   Yes [provider]  furosemide (LASIX) 40 MG tablet Take 80 mg by mouth every morning.    Yes [provider]  levothyroxine (SYNTHROID, LEVOTHROID) 50 MCG tablet Take 50 mcg by mouth daily before breakfast.   Yes [provider]  lisinopril (PRINIVIL,ZESTRIL) 40 MG tablet Take 40 mg by mouth daily.   Yes [provider]  Multiple Vitamins-Calcium (ONE-A-DAY WOMENS FORMULA) TABS Take 1 tablet by mouth daily.   Yes [provider]  potassium chloride SA (K-DUR,KLOR-CON) 20 MEQ tablet Take 40 mEq by mouth every morning.   Yes [provider]  timolol (TIMOPTIC) 0.5 % ophthalmic solution Place 1 drop into both eyes 2 (two) times daily.   Yes [provider]  clopidogrel (PLAVIX) 75 MG tablet Take 1 tablet (75 mg total) by mouth daily. 09/21/17   Nita Sells, MD   . ALPRAZolam  0.5 mg Oral QHS  . buPROPion  150 mg Oral BID  . carvedilol  25 mg Oral BID WC  . ferrous sulfate  325 mg Oral BID WC  . FLUoxetine  40 mg Oral Daily  . furosemide  80 mg Oral q morning - 10a  . levothyroxine  50 mcg Oral QAC breakfast  . lisinopril  40 mg Oral Daily  . multivitamin with minerals  1 tablet Oral Daily  . [START ON 09/24/2017] pantoprazole  40 mg Intravenous Q12H  . potassium chloride SA  40 mEq Oral q morning - 10a  . pravastatin  20 mg Oral q1800  . timolol  1 drop Both Eyes BID   PRN Meds acetaminophen **OR** acetaminophen (TYLENOL) oral liquid 160 mg/5 mL **OR** acetaminophen, ondansetron Results for orders placed or performed during the hospital encounter of 09/18/17 (from the past 48 hour(s))  CBC with Differential/Platelet     Status: Abnormal   Collection Time: 09/20/17  6:24 PM  Result Value Ref Range   WBC 13.7 (H) 4.0 - 10.5 K/uL   RBC 3.74 (L) 3.87 - 5.11 MIL/uL   Hemoglobin 11.7 (L) 12.0 - 15.0 g/dL   HCT 36.2 36.0 - 46.0 %   MCV 96.8 78.0 - 100.0 fL   MCH 31.3 26.0 - 34.0 pg   MCHC 32.3 30.0 - 36.0 g/dL   RDW 13.4 11.5 - 15.5 %   Platelets 310 150 - 400 K/uL   Neutrophils Relative % 85 %   Neutro Abs 11.5 (H) 1.7 - 7.7 K/uL   Lymphocytes Relative 9 %   Lymphs Abs 1.3 0.7 - 4.0 K/uL   Monocytes Relative 6 %   Monocytes Absolute 0.8 0.1 - 1.0 K/uL   Eosinophils Relative 0 %   Eosinophils Absolute 0.1 0.0 - 0.7 K/uL   Basophils Relative 0 %   Basophils Absolute 0.0 0.0 - 0.1 K/uL  Type and screen [place]     Status: None   Collection Time: 09/20/17  6:24 PM  Result Value Ref Range   ABO/RH(D) O POS    Antibody Screen NEG    Sample Expiration 09/23/2017    CBC with Differential/Platelet     Status: Abnormal   Collection Time: 09/21/17  9:17 AM  Result Value Ref Range   WBC 14.1 (H) 4.0 - 10.5 K/uL   RBC 3.18 (L) 3.87 - 5.11 MIL/uL   Hemoglobin 10.1 (L) 12.0 - 15.0 g/dL   HCT  30.2 (L) 36.0 - 46.0 %   MCV 95.0 78.0 - 100.0 fL   MCH 31.8 26.0 - 34.0 pg   MCHC 33.4 30.0 - 36.0 g/dL   RDW 13.4 11.5 - 15.5 %   Platelets 307 150 - 400 K/uL   Neutrophils Relative % 77 %   Neutro Abs 10.8 (H) 1.7 - 7.7 K/uL   Lymphocytes Relative 9 %   Lymphs Abs 1.3 0.7 - 4.0 K/uL   Monocytes Relative 14 %   Monocytes Absolute 2.0 (H) 0.1 - 1.0 K/uL   Eosinophils Relative 0 %   Eosinophils Absolute 0.0 0.0 - 0.7 K/uL   Basophils Relative 0 %   Basophils Absolute 0.0 0.0 - 0.1 K/uL  Basic metabolic panel     Status: Abnormal   Collection Time: 09/21/17  9:17 AM  Result Value Ref Range   Sodium 135 135 - 145 mmol/L   Potassium 3.7 3.5 - 5.1 mmol/L   Chloride 96 (L) 101 - 111 mmol/L   CO2 28 22 - 32 mmol/L   Glucose, Bld 170 (H) 65 - 99 mg/dL   BUN 55 (H) 6 - 20 mg/dL   Creatinine, Ser 1.03 (H) 0.44 - 1.00 mg/dL   Calcium 10.9 (H) 8.9 - 10.3 mg/dL   GFR calc non Af Amer 46 (L) >60 mL/min   GFR calc Af Amer 53 (L) >60 mL/min    Comment: (NOTE) The eGFR has been calculated using the CKD EPI equation. This calculation has not been validated in all clinical situations. eGFR's persistently <60 mL/min signify possible Chronic Kidney Disease.    Anion gap 11 5 - 15    Dg Shoulder Right Port  Result Date: 09/19/2017 CLINICAL DATA:  Chronic right arm pain EXAM: PORTABLE RIGHT SHOULDER COMPARISON:  09/18/2017, 03/13/2010 FINDINGS: There is complete resorption of the proximal portion of the right humerus. Some calcific changes noted along the superolateral aspect of the joint capsule are noted. Increased lucency is noted likely related to large joint effusion. Some erosive changes of the glenoid are noted as well. The underlying bony thorax is within normal  limits. IMPRESSION: Chronic appearing changes of the right shoulder joint with resorption of the humeral head and portions of the glenoid with known large joint effusion. This is likely related to a chronic inflammatory change. The need for further evaluation can be determined on a clinical basis. Electronically Signed   By: Inez Catalina M.D.   On: 09/19/2017 18:03   ROS: As above            Blood pressure 92/65, pulse 89, temperature (!) 97.5 F (36.4 C), temperature source Axillary, resp. rate 16, height _0  (1.499 m), weight 53.4 kg (117 lb 11.6 oz), SpO2 100 %.  Physical exam:   General--very elderly white female in no distress ENT--cannot get her to open her eyes.  Mouth appears to be grossly normal with no blood in her mouth. Neck--fairly stiff does not move the neck well  heart--regular rate and rhythm without murmurs or gallops Lungs--clear Abdomen--nondistended and soft with mild tenderness in the epigastric area Psych--pleasantly confused   Assessment: 1.  Hematemesis.  This is a bit confusing since the patient has been on Nexium chronically according to her outpatient records.  She will need to be anticoagulated with antiplatelet agents and I think it is important to determine if she is in fact bleeding from an upper GI source such as a gastric tumor etc.  She has had previous EGD.  Discussed repeating EGD with her daughter who is agreeable. 2.  Recent stroke/TIA 3.  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  Diabetes type 2 5.  Chronic constipation 6.  Hematemesis  Plan:. 1.  We will proceed with EGD tomorrow to make certain that there is not a tumor or some other lesion in the stomach that has been causing her hematemesis.  Have discussed this in detail with the daughter who is agreeable.   Nancy Fetter 09/21/2017, 3:56 PM   This note was created using voice recognition software and minor errors may Have occurred  unintentionally. Pager: [phone] If no answer or after hours call [phone]

## 2017-09-21 NOTE — Progress Notes (Signed)
Patient continues to have thin, watery, dark brown emesis. Has been on clear liquid diet throughout the night. No response to oral zofran.  MD paged with this information. Wendee Copp

## 2017-09-21 NOTE — Progress Notes (Signed)
Physical Therapy Treatment Patient Details Name: [name] MRN: [id number] DOB: Mar 27, 1923 Today's Date: 09/21/2017    History of Present Illness [name] is a 83 y.o. female with history of hypertension, possibly CHF, hypothyroidism and meningioma was brought to the ER after patient was found to have left-sided weakness. CT negative for acute changes, showed meningioma.  PMH positive for anemia, arthritis and HTN.  MRI positive for Acute/subacute nonhemorrhagic infarct involving the anterior right insular ribbon and right frontal operculum..    PT Comments    Patient progressing with mobility, requiring less assistance today.  Still seems confused, will need 24 hour care at home. Patient will benefit from continued PT until discharge and at home to progress mobility and independence.     Follow Up Recommendations  Home health PT;Supervision/Assistance - 24 hour     Equipment Recommendations  Other (comment)(per eval, daughter interested in transport chair)    Recommendations for Other Services       Precautions / Restrictions Precautions Precautions: Fall Precaution Comments: reports multiple falls at home    Mobility  Bed Mobility Overal bed mobility: Needs Assistance Bed Mobility: Supine to Sit;Sit to Supine     Supine to sit: Supervision Sit to supine: Min assist   General bed mobility comments: assist for LE when returning to bed  Transfers Overall transfer level: Needs assistance Equipment used: Rolling walker (2 wheeled) Transfers: Sit to/from Stand Sit to Stand: Min assist         General transfer comment: min assist to power up from bed and bedside commode  Ambulation/Gait Ambulation/Gait assistance: Min assist Ambulation Distance (Feet): 50 Feet Assistive device: Rolling walker (2 wheeled) Gait Pattern/deviations: Shuffle;Trunk flexed;Decreased stride length     General Gait Details: increased proximity to walker and shuffling feet, high  fall risk   Stairs            Wheelchair Mobility    Modified Rankin (Stroke Patients Only)       Balance Overall balance assessment: History of Falls;Needs assistance Sitting-balance support: No upper extremity supported Sitting balance-Leahy Scale: Fair     Standing balance support: Bilateral upper extremity supported Standing balance-Leahy Scale: Poor Standing balance comment: reliant on RW for balance                            Cognition Arousal/Alertness: Awake/alert Behavior During Therapy: WFL for tasks assessed/performed Overall Cognitive Status: Impaired/Different from baseline                                 General Comments: per SLP note, short term memory loss is new; Patient asking me to tell her daughter to stop by, but daughter not visiting at this time.  Able to state location and situation when asked.      Exercises      General Comments        Pertinent Vitals/Pain Pain Assessment: No/denies pain    Home Living                      Prior Function            PT Goals (current goals can now be found in the care plan section) Progress towards PT goals: Progressing toward goals    Frequency    Min 3X/week      PT Plan Current plan remains appropriate  Co-evaluation              AM-PAC PT "6 Clicks" Daily Activity  Outcome Measure  Difficulty turning over in bed (including adjusting bedclothes, sheets and blankets)?: A Little Difficulty moving from lying on back to sitting on the side of the bed? : A Little Difficulty sitting down on and standing up from a chair with arms (e.g., wheelchair, bedside commode, etc,.)?: Unable Help needed moving to and from a bed to chair (including a wheelchair)?: A Little Help needed walking in hospital room?: A Little Help needed climbing 3-5 steps with a railing? : A Lot 6 Click Score: 15    End of Session Equipment Utilized During Treatment: Gait  belt Activity Tolerance: Patient tolerated treatment well Patient left: in bed;with call bell/phone within reach;with bed alarm set   PT Visit Diagnosis: History of falling (Z91.81);Other abnormalities of gait and mobility (R26.89);Muscle weakness (generalized) (M62.81)     Time: 1335-1350 PT Time Calculation (min) (ACUTE ONLY): 15 min  Charges:  $Gait Training: 8-22 mins                    G Codes:       Oct 12, 2017 [facility], PT [phone]     Shanna Cisco 10/12/2017, 2:00 PM

## 2017-09-22 LAB — BASIC METABOLIC PANEL
Anion gap: 8 (ref 5–15)
BUN: 52 mg/dL — AB (ref 6–20)
CHLORIDE: 107 mmol/L (ref 101–111)
CO2: 27 mmol/L (ref 22–32)
CREATININE: 1.05 mg/dL — AB (ref 0.44–1.00)
Calcium: 9.3 mg/dL (ref 8.9–10.3)
GFR calc Af Amer: 52 mL/min — ABNORMAL LOW (ref >60)
GFR calc non Af Amer: 45 mL/min — ABNORMAL LOW (ref >60)
Glucose, Bld: 96 mg/dL (ref 65–99)
POTASSIUM: 3.4 mmol/L — AB (ref 3.5–5.1)
SODIUM: 142 mmol/L (ref 135–145)

## 2017-09-22 LAB — CBC WITH DIFFERENTIAL/PLATELET
Basophils Absolute: 0 10*3/uL (ref 0.0–0.1)
Basophils Relative: 0 %
EOS ABS: 0.1 10*3/uL (ref 0.0–0.7)
Eosinophils Relative: 1 %
HCT: 25.1 % — ABNORMAL LOW (ref 36.0–46.0)
HEMOGLOBIN: 8.3 g/dL — AB (ref 12.0–15.0)
Lymphocytes Relative: 10 %
Lymphs Abs: 1.3 10*3/uL (ref 0.7–4.0)
MCH: 32 pg (ref 26.0–34.0)
MCHC: 33.1 g/dL (ref 30.0–36.0)
MCV: 96.9 fL (ref 78.0–100.0)
MONOS PCT: 10 %
Monocytes Absolute: 1.3 10*3/uL — ABNORMAL HIGH (ref 0.1–1.0)
NEUTROS PCT: 79 %
Neutro Abs: 10.2 10*3/uL — ABNORMAL HIGH (ref 1.7–7.7)
Platelets: 267 10*3/uL (ref 150–400)
RBC: 2.59 MIL/uL — AB (ref 3.87–5.11)
RDW: 13.9 % (ref 11.5–15.5)
WBC: 13 10*3/uL — AB (ref 4.0–10.5)

## 2017-09-22 MED ORDER — SODIUM CHLORIDE 0.9 % IV SOLN: INTRAVENOUS | Status: DC

## 2017-09-22 NOTE — Progress Notes (Signed)
[facility] PROGRESS NOTE Subjective the patient was scheduled for EGD this morning and then order had been written for her to be NPO but she apparently was fed applesauce this morning and the procedure was therefore cancel. Patient reports no gross bleeding but her hemoglobin has declined from 10 to 8.3.  Objective: Vital signs in last 24 hours: Temp:  [97.5 F (36.4 C)-98 F (36.7 C)] 97.7 F (36.5 C) (01/01 1017) Pulse Rate:  [78-89] 78 (01/01 1017) Resp:  [16-20] 19 (01/01 1017) BP: (92-168)/(55-65) 168/58 (01/01 1017) SpO2:  [91 %-100 %] 95 % (01/01 1017) Last BM Date: 09/20/17  Intake/Output from previous day: 12/31 0701 - 01/01 0700 In: 724.2 [P.O.:120; I.V.:604.2] Out: -  Intake/Output this shift: No intake/output data recorded.  PE: General-- no acute distress much more alert this morning  Abdomen-- nontender  Lab Results: Recent Labs    09/20/17 1824 09/21/17 0917 09/22/17 0546  WBC 13.7* 14.1* 13.0*  HGB 11.7* 10.1* 8.3*  HCT 36.2 30.2* 25.1*  PLT 310 307 267   BMET Recent Labs    09/21/17 0917 09/22/17 0546  NA 135 142  K 3.7 3.4*  CL 96* 107  CO2 28 27  CREATININE 1.03* 1.05*   LFT No results for input(s): PROT, AST, ALT, ALKPHOS, BILITOT, BILIDIR, IBILI in the last 72 hours. PT/INR No results for input(s): LABPROT, INR in the last 72 hours. PANCREAS No results for input(s): LIPASE in the last 72 hours.       Studies/Results: No results found.  Medications: I have reviewed the patient's current medications.  Assessment:   1. Hematemesis. Unfortunately the patient was fed applesauce's morning and will be unable to perform EGD today. Will need to reschedule tomorrow   Plan: we will give her subsequent clear liquids today maker NPO after midnight again and hope to perform EGD in the morning. We'll discuss with her family and would keep her on IV PPI.   Nancy Fetter 09/22/2017, 11:50 AM  This note was created using  voice recognition software. Minor errors may Have occurred unintentionally.  Pager: [phone] If no answer or after hours call [phone]

## 2017-09-22 NOTE — Anesthesia Preprocedure Evaluation (Addendum)
Anesthesia Evaluation  Patient identified by MRN, date of birth, ID band Patient awake    Reviewed: Allergy & Precautions, NPO status , Patient's Chart, lab work & pertinent test results  Airway Mallampati: II  TM Distance: >3 FB Neck ROM: Full    Dental no notable dental hx.    Pulmonary neg pulmonary ROS,    Pulmonary exam normal breath sounds clear to auscultation       Cardiovascular hypertension, + Peripheral Vascular Disease  Normal cardiovascular exam Rhythm:Regular Rate:Normal  ECHO: LVEF 65-70%, moderate LVH, normal wall motion, mild calcific aortic valve stenosis, mild MR, severe LAE, trivial TR, RVSP 77mHg, normal IVC   Neuro/Psych MRI showed right MCA moderate-sized infarct CTA head neck showed left ICA chronic occlusion, right M2 occlusion corresponding to right MCA infarct   CVA negative psych ROS   GI/Hepatic negative GI ROS, Neg liver ROS,   Endo/Other  negative endocrine ROS  Renal/GU negative Renal ROS  negative genitourinary   Musculoskeletal negative musculoskeletal ROS (+)   Abdominal   Peds negative pediatric ROS (+)  Hematology  (+) anemia ,   Anesthesia Other Findings   Reproductive/Obstetrics negative OB ROS                          Anesthesia Physical Anesthesia Plan  ASA: IV and emergent  Anesthesia Plan: MAC   Post-op Pain Management:    Induction: Intravenous  PONV Risk Score and Plan: 0  Airway Management Planned: Simple Face Mask  Additional Equipment:   Intra-op Plan:   Post-operative Plan:   Informed Consent: I have reviewed the patients History and Physical, chart, labs and discussed the procedure including the risks, benefits and alternatives for the proposed anesthesia with the patient or authorized representative who has indicated his/her understanding and acceptance.   Dental advisory given  Plan Discussed with: CRNA and  Surgeon  Anesthesia Plan Comments:         Anesthesia Quick Evaluation

## 2017-09-22 NOTE — Progress Notes (Signed)
PROGRESS NOTE  [name] WCH:[id number] DOB: 1922/10/26 DOA: 09/18/2017 PCP: Lawerance Cruel, MD  HPI/Recap of past 24 hours: HPI from Dr Hal Hope on 09/19/17 [name] is a 84 y.o. female with history of hypertension, possibly CHF, hypothyroidism and meningioma was brought to the ER after patient was found to have left-sided weakness.  As per the patient's daughter, was found to have slumped on her chair not moving her left upper and lower extremities.  Also mild left facial droop was seen. Patient did not lose consciousness or did not have any seizure-like activities or any incontinence of urine or tongue bite.  Patient symptoms lasted for less than 3 minutes following which patient came back to baseline. In the ER patient was appearing nonfocal. CT head was showing possible meningioma but otherwise nothing acute.  EKG was showing sinus bradycardia.  Neurologist on-call was consulted and patient admitted for further management.  Today, no gross bleeding noted, reported some epigastric tenderness. Pt denies any chest pain, SOB, D/C, fever/chills.   Assessment/Plan: Active Problems:   Intracranial vascular stenosis   Essential hypertension   Cerebrovascular accident (CVA) due to occlusion of right middle cerebral artery (HCC)   Carotid occlusion, left   Stenosis of right carotid artery   Hyperlipidemia   Meningioma (HCC)   CVA (cerebral vascular accident) ([place])  #Right MCA acute infarct Pt currently stable MRI showed right MCA moderate-sized infarct CTA head neck showed left ICA chronic occlusion, right M2 occlusion corresponding to right MCA infarct CT head: Stable 2.4 cm partially calcified right parasellar mass consistent with history of meningioma  ECHO: LVEF 65-70%, moderate LVH, normal wall motion, mild calcific aortic valve stenosis, mild MR, severe LAE, trivial TR, RVSP 34mmHg, normal IVC A1c 5.1, LDL 75 Neurology consulted: Cardiac event monitor as outpt to r/o  Afib, Continue DAPT for 3 months and then plavix alone (ASA 325mg  + Plavix 75mg ) Antiplatelet therapy held due to ??GIB PT/OT/SLP: Home health PT/OT/SLP Monitor closely  #??Upper GIB +coffee ground emesis, elevated BUN, drop in hgb GI consulted- Plan for EGD tomorrow am, was supposed to happen today, but pt was fed apple sauce with meds despite order placed for NPO Held DAPT, lovenox due to possible GIB IV protonix IVF Daily cbc  #Hypertension Stable, allow for permissive hypertension Held PTA lasix, coreg, lisinopril  #Hypothyroidism TSH 3.515 Continue Synthroid  #History of iron deficiency anemia Continue iron supplements  #Depression Continue antidepressant   Code Status: Full  Family Communication: None at bedside  Disposition Plan: Home once stable   Consultants:  Neurology  GI  Procedures:  None  Antimicrobials:  None  DVT prophylaxis:  SCDs   Objective: Vitals:   09/21/17 1745 09/21/17 2159 09/22/17 0200 09/22/17 1017  BP: (!) 147/55 (!) 114/58 (!) 139/55 (!) 168/58  Pulse: 79 85  78  Resp: 20 18  19   Temp:  98 F (36.7 C) 97.8 F (36.6 C) 97.7 F (36.5 C)  TempSrc:  Oral Axillary Axillary  SpO2: 97% 91% 94% 95%  Weight:      Height:        Intake/Output Summary (Last 24 hours) at 09/22/2017 1222 Last data filed at 09/22/2017 0653 Gross per 24 hour  Intake 724.17 ml  Output -  Net 724.17 ml   Filed Weights   09/18/17 1813 09/18/17 2250  Weight: 55.3 kg (122 lb) 53.4 kg (117 lb 11.6 oz)    Exam:   General: Alert, awake, oriented, not in acute distress  Cardiovascular: S1-S2 present, no added heart sounds  Respiratory: Chest clear bilaterally  Abdomen: Epigastric tenderness, soft, nondistended, bowel sounds present  Musculoskeletal: No pedal edema bilaterally  Skin: Normal  Psychiatry: Normal mood   Data Reviewed: CBC: Recent Labs  Lab 09/18/17 1921 09/18/17 1937 09/19/17 0408 09/20/17 1824 09/21/17 0917  09/22/17 0546  WBC 6.6  --  7.1 13.7* 14.1* 13.0*  NEUTROABS 4.4  --   --  11.5* 10.8* 10.2*  HGB 12.7 12.6 11.3* 11.7* 10.1* 8.3*  HCT 39.3 37.0 35.3* 36.2 30.2* 25.1*  MCV 97.8  --  97.8 96.8 95.0 96.9  PLT 254  --  249 310 307 595   Basic Metabolic Panel: Recent Labs  Lab 09/18/17 1921 09/18/17 1937 09/21/17 0917 09/22/17 0546  NA 140 141 135 142  K 4.2 4.3 3.7 3.4*  CL 103 103 96* 107  CO2 29  --  28 27  GLUCOSE 96 92 170* 96  BUN 16 19 55* 52*  CREATININE 0.92 1.00 1.03* 1.05*  CALCIUM 9.6  --  10.9* 9.3   GFR: Estimated Creatinine Clearance: 25.5 mL/min (A) (by C-G formula based on SCr of 1.05 mg/dL (H)). Liver Function Tests: Recent Labs  Lab 09/18/17 1921  AST 23  ALT 17  ALKPHOS 145*  BILITOT 0.8  PROT 6.7  ALBUMIN 3.7   No results for input(s): LIPASE, AMYLASE in the last 168 hours. No results for input(s): AMMONIA in the last 168 hours. Coagulation Profile: Recent Labs  Lab 09/18/17 1921  INR 1.08   Cardiac Enzymes: No results for input(s): CKTOTAL, CKMB, CKMBINDEX, TROPONINI in the last 168 hours. BNP (last 3 results) No results for input(s): PROBNP in the last 8760 hours. HbA1C: No results for input(s): HGBA1C in the last 72 hours. CBG: No results for input(s): GLUCAP in the last 168 hours. Lipid Profile: No results for input(s): CHOL, HDL, LDLCALC, TRIG, CHOLHDL, LDLDIRECT in the last 72 hours. Thyroid Function Tests: No results for input(s): TSH, T4TOTAL, FREET4, T3FREE, THYROIDAB in the last 72 hours. Anemia Panel: No results for input(s): VITAMINB12, FOLATE, FERRITIN, TIBC, IRON, RETICCTPCT in the last 72 hours. Urine analysis:    Component Value Date/Time   COLORURINE STRAW (A) 09/18/2017 1824   APPEARANCEUR CLEAR 09/18/2017 1824   LABSPEC 1.006 09/18/2017 1824   PHURINE 7.0 09/18/2017 1824   GLUCOSEU NEGATIVE 09/18/2017 1824   HGBUR NEGATIVE 09/18/2017 1824   BILIRUBINUR NEGATIVE 09/18/2017 1824   KETONESUR NEGATIVE 09/18/2017  1824   PROTEINUR NEGATIVE 09/18/2017 1824   UROBILINOGEN 0.2 08/30/2010 1430   NITRITE NEGATIVE 09/18/2017 1824   LEUKOCYTESUR NEGATIVE 09/18/2017 1824   Sepsis Labs: @LABRCNTIP (procalcitonin:4,lacticidven:4)  )No results found for this or any previous visit (from the past 240 hour(s)).    Studies: No results found.  Scheduled Meds: . ALPRAZolam  0.5 mg Oral QHS  . buPROPion  150 mg Oral BID  . ferrous sulfate  325 mg Oral BID WC  . FLUoxetine  40 mg Oral Daily  . levothyroxine  50 mcg Oral QAC breakfast  . multivitamin with minerals  1 tablet Oral Daily  . [START ON 09/24/2017] pantoprazole  40 mg Intravenous Q12H  . potassium chloride SA  40 mEq Oral q morning - 10a  . pravastatin  20 mg Oral q1800  . timolol  1 drop Both Eyes BID    Continuous Infusions: . sodium chloride    . sodium chloride 75 mL/hr at 09/22/17 1016  . pantoprozole (PROTONIX) infusion 8 mg/hr (09/22/17 1035)  LOS: 1 day     Alma Friendly, MD [facility]   If 7PM-7AM, please contact night-coverage [email or web address] Password [facility] 09/22/2017, 12:22 PM

## 2017-09-23 ENCOUNTER — Encounter (HOSPITAL_COMMUNITY): Payer: Self-pay | Admitting: *Deleted

## 2017-09-23 ENCOUNTER — Inpatient Hospital Stay (HOSPITAL_COMMUNITY): Payer: Medicare Other

## 2017-09-23 ENCOUNTER — Inpatient Hospital Stay (HOSPITAL_COMMUNITY): Payer: Medicare Other | Admitting: Certified Registered Nurse Anesthetist

## 2017-09-23 ENCOUNTER — Encounter (HOSPITAL_COMMUNITY)
Admission: EM | Disposition: A | Discharge status: Hospice | Payer: Self-pay | Source: Home / Self Care | Attending: Family Medicine

## 2017-09-23 HISTORY — PX: ESOPHAGOGASTRODUODENOSCOPY: SHX5428

## 2017-09-23 LAB — CBC WITH DIFFERENTIAL/PLATELET
Basophils Absolute: 0 10*3/uL (ref 0.0–0.1)
Basophils Relative: 0 %
EOS ABS: 0.4 10*3/uL (ref 0.0–0.7)
EOS PCT: 3 %
HCT: 23.2 % — ABNORMAL LOW (ref 36.0–46.0)
Hemoglobin: 7.5 g/dL — ABNORMAL LOW (ref 12.0–15.0)
LYMPHS ABS: 1.3 10*3/uL (ref 0.7–4.0)
Lymphocytes Relative: 10 %
MCH: 32.1 pg (ref 26.0–34.0)
MCHC: 32.3 g/dL (ref 30.0–36.0)
MCV: 99.1 fL (ref 78.0–100.0)
MONOS PCT: 11 %
Monocytes Absolute: 1.4 10*3/uL — ABNORMAL HIGH (ref 0.1–1.0)
Neutro Abs: 9.6 10*3/uL — ABNORMAL HIGH (ref 1.7–7.7)
Neutrophils Relative %: 76 %
Platelets: 237 10*3/uL (ref 150–400)
RBC: 2.34 MIL/uL — ABNORMAL LOW (ref 3.87–5.11)
RDW: 14.1 % (ref 11.5–15.5)
WBC: 12.7 10*3/uL — AB (ref 4.0–10.5)

## 2017-09-23 LAB — URINALYSIS, COMPLETE (UACMP) WITH MICROSCOPIC
Bilirubin Urine: NEGATIVE
Glucose, UA: NEGATIVE mg/dL
KETONES UR: NEGATIVE mg/dL
NITRITE: NEGATIVE
Protein, ur: NEGATIVE mg/dL
RBC / HPF: NONE SEEN RBC/hpf (ref 0–5)
SPECIFIC GRAVITY, URINE: 1.01 (ref 1.005–1.030)
pH: 6 (ref 5.0–8.0)

## 2017-09-23 LAB — BASIC METABOLIC PANEL
Anion gap: 8 (ref 5–15)
BUN: 27 mg/dL — AB (ref 6–20)
CHLORIDE: 107 mmol/L (ref 101–111)
CO2: 25 mmol/L (ref 22–32)
CREATININE: 0.93 mg/dL (ref 0.44–1.00)
Calcium: 8.7 mg/dL — ABNORMAL LOW (ref 8.9–10.3)
GFR calc Af Amer: 60 mL/min — ABNORMAL LOW (ref >60)
GFR calc non Af Amer: 52 mL/min — ABNORMAL LOW (ref >60)
Glucose, Bld: 81 mg/dL (ref 65–99)
Potassium: 4 mmol/L (ref 3.5–5.1)
SODIUM: 140 mmol/L (ref 135–145)

## 2017-09-23 LAB — PREPARE RBC (CROSSMATCH)

## 2017-09-23 SURGERY — EGD (ESOPHAGOGASTRODUODENOSCOPY)
Anesthesia: Monitor Anesthesia Care

## 2017-09-23 MED ORDER — LACTATED RINGERS IV SOLN
INTRAVENOUS | Status: DC | PRN
  Administered 2017-09-23: 10:00:00 via INTRAVENOUS

## 2017-09-23 MED ORDER — CARVEDILOL 12.5 MG PO TABS
12.5000 mg | ORAL_TABLET | Freq: Two times a day (BID) | ORAL | Status: DC
  Administered 2017-09-23 – 2017-09-27 (×8): 12.5 mg via ORAL
  Filled 2017-09-23 (×9): qty 1

## 2017-09-23 MED ORDER — PROPOFOL 500 MG/50ML IV EMUL
INTRAVENOUS | Status: DC | PRN
  Administered 2017-09-23: 50 ug/kg/min via INTRAVENOUS

## 2017-09-23 MED ORDER — PROPOFOL 10 MG/ML IV BOLUS
INTRAVENOUS | Status: DC | PRN
  Administered 2017-09-23 (×2): 20 mg via INTRAVENOUS

## 2017-09-23 MED ORDER — FUROSEMIDE 10 MG/ML IJ SOLN
40.0000 mg | Freq: Once | INTRAMUSCULAR | Status: AC
  Administered 2017-09-23: 40 mg via INTRAVENOUS

## 2017-09-23 MED ORDER — DIPHENHYDRAMINE HCL 50 MG/ML IJ SOLN
25.0000 mg | Freq: Once | INTRAMUSCULAR | Status: AC
  Administered 2017-09-23: 25 mg via INTRAVENOUS

## 2017-09-23 MED ORDER — DIPHENHYDRAMINE HCL 50 MG/ML IJ SOLN
INTRAMUSCULAR | Status: AC
  Filled 2017-09-23: qty 1

## 2017-09-23 MED ORDER — FENTANYL CITRATE (PF) 100 MCG/2ML IJ SOLN: 25.0000 ug | INTRAMUSCULAR | Status: DC | PRN

## 2017-09-23 MED ORDER — FUROSEMIDE 10 MG/ML IJ SOLN
INTRAMUSCULAR | Status: AC
  Filled 2017-09-23: qty 4

## 2017-09-23 MED ORDER — METHYLPREDNISOLONE SODIUM SUCC 125 MG IJ SOLR
125.0000 mg | Freq: Once | INTRAMUSCULAR | Status: AC
  Administered 2017-09-23: 125 mg via INTRAVENOUS
  Filled 2017-09-23: qty 2

## 2017-09-23 MED ORDER — SODIUM CHLORIDE 0.9 % IV SOLN: INTRAVENOUS | Status: DC

## 2017-09-23 MED ORDER — SODIUM CHLORIDE 0.9 % IV SOLN: Freq: Once | INTRAVENOUS | Status: DC

## 2017-09-23 NOTE — Op Note (Signed)
[facility] Patient Name: [name] Procedure Date : 09/23/2017 MRN: [id number] Attending MD: Nancy Fetter Dr., MD Date of Birth: 08-Nov-1922 CSN: [id number] Age: 84 Admit Type: Inpatient Procedure:                Upper GI endoscopy Indications:              Unexplained iron deficiency anemia Providers:                Jeneen Rinks L. Stiles Maxcy Dr., MD, Zenon Mayo, RN, Cletis [place], Technician Referring MD:              Medicines:                Monitored Anesthesia Care Complications:            No immediate complications. Estimated Blood Loss:     Estimated blood loss: none. Procedure:                Pre-Anesthesia Assessment:                           - Prior to the procedure, a History and Physical                            was performed, and patient medications and                            allergies were reviewed. The patient's tolerance of                            previous anesthesia was also reviewed. The risks                            and benefits of the procedure and the sedation                            options and risks were discussed with the patient.                            All questions were answered, and informed consent                            was obtained. Prior Anticoagulants: The patient has                            taken no previous anticoagulant or antiplatelet                            agents. ASA Grade Assessment: IV - A patient with                            severe systemic disease that is a constant threat  to life. After reviewing the risks and benefits,                            the patient was deemed in satisfactory condition to                            undergo the procedure.                           After obtaining informed consent, the endoscope was                            passed under direct vision. Throughout the                            procedure, the patient's  blood pressure, pulse, and                            oxygen saturations were monitored continuously. The                            EG-[id number] ([id number]) scope was introduced through the                            mouth, and advanced to the second part of duodenum.                            The upper GI endoscopy was accomplished without                            difficulty. The patient tolerated the procedure                            well. Scope In: Scope Out: Findings:      The esophagus was normal.      The entire examined stomach was normal.      There is no endoscopic evidence of bleeding, erythema or inflammatory       changes suggestive of gastritis, inflammation, mucosal abnormalities,       ulceration, varices or tumor in the entire examined stomach. there was       minimal scope trauma right at the GE junction but no signs of recent       interactive bleeding.      The examined duodenum was normal.      A deformity was found in the entire examined stomach. she had a very       large J shaped stomach. Impression:               - Normal esophagus.                           - Normal stomach.                           - Normal examined duodenum.                           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                           - No specimens collected. Moderate Sedation:      See anesthesia note, no moderate sedation. Recommendation:           - Return patient to hospital ward for ongoing care.                           - Clear liquid diet.                           - Continue present medications.                           - The findings and recommendations were discussed                            with the referring physician. Procedure Code(s):        --- Professional ---                           [phone], Esophagogastroduodenoscopy, flexible,                            transoral; diagnostic, including collection of                            specimen(s) by  brushing or washing, when performed                            (separate procedure) Diagnosis Code(s):        --- Professional ---                           D50.9, Iron deficiency anemia, unspecified                           K31.89, Other diseases of stomach and duodenum CPT copyright 2016 [employer]. All rights reserved. The codes documented in this report are preliminary and upon coder review may  be revised to meet current compliance requirements. Nancy Fetter Dr., MD 09/23/2017 10:16:07 AM This report has been signed electronically. Number of Addenda: 0

## 2017-09-23 NOTE — Progress Notes (Signed)
Physical Therapy Treatment Patient Details Name: [name] MRN: [id number] DOB: June 25, 1923 Today's Date: 09/23/2017    History of Present Illness [name] is a 84 y.o. female with history of hypertension, possibly CHF, hypothyroidism and meningioma was brought to the ER after patient was found to have left-sided weakness. CT negative for acute changes, showed meningioma.  PMH positive for anemia, arthritis and HTN.  MRI positive for Acute/subacute nonhemorrhagic infarct involving the anterior right insular ribbon and right frontal operculum..    PT Comments    Patient currently 100% risk for falls if ambulating unaided due to poor safety awareness, decreased balance, decreased endurance and general weakness.  This session requiring increased assist for all mobility mod to max A and would not be safe in the home as does not have 24 hour capable assist available.  Will need SNF level rehab at d/c.  Daughter in the room and agrees.  PT to follow acutely.    Follow Up Recommendations  SNF;Supervision/Assistance - 24 hour     Equipment Recommendations  Other (comment)(transport chair)    Recommendations for Other Services       Precautions / Restrictions Precautions Precautions: Fall Precaution Comments: reports multiple falls at home    Mobility  Bed Mobility Overal bed mobility: Needs Assistance Bed Mobility: Supine to Sit;Sit to Supine     Supine to sit: Mod assist Sit to supine: Mod assist   General bed mobility comments: increased time to follow commands for coming to EOB, assist to lift trunk, then to scoot to EOB with increased time and effort; to supine assist for legs onto bed and cues for positioning  Transfers Overall transfer level: Needs assistance Equipment used: Rolling walker (2 wheeled) Transfers: Sit to/from Stand Sit to Stand: Mod assist;Max assist         General transfer comment: initially pt needing max A for lifting off EOB and initially with  posterior lean, returned to sitting and second attempt improved, off BSC over toilet mod A with increased time for anterior lean  Ambulation/Gait Ambulation/Gait assistance: Mod assist Ambulation Distance (Feet): 20 Feet(x 2) Assistive device: Rolling walker (2 wheeled) Gait Pattern/deviations: Decreased stride length;Shuffle;Decreased step length - left;Wide base of support;Trunk flexed     General Gait Details: mod A for balance, walker safety and maneuvering around sink to get to the bathroom.  Pt flexed and wheezing wtih SpO2 86% up to 89-90% after seated rest and pursed lip breathing x 1 minute.     Stairs            Wheelchair Mobility    Modified Rankin (Stroke Patients Only) Modified Rankin (Stroke Patients Only) Pre-Morbid Rankin Score: Moderate disability Modified Rankin: Moderately severe disability     Balance Overall balance assessment: History of Falls;Needs assistance Sitting-balance support: No upper extremity supported Sitting balance-Leahy Scale: Fair   Postural control: Posterior lean Standing balance support: Bilateral upper extremity supported Standing balance-Leahy Scale: Poor Standing balance comment: reliant on RW and external assist up to mod A for balance                            Cognition Arousal/Alertness: Lethargic;Suspect due to medications Behavior During Therapy: [facility] for tasks assessed/performed Overall Cognitive Status: Impaired/Different from baseline Area of Impairment: Memory;Following commands;Safety/judgement                       Following Commands: Follows one step commands  with increased time Safety/Judgement: Decreased awareness of safety;Decreased awareness of deficits            Exercises      General Comments General comments (skin integrity, edema, etc.): daughter in room and reports does not think pt can go home due to not having enough hours of aide assist (only 2 hours in mornings) to cover  amount of time she is at work (also feels she cannot give enough support - daughter utilizes a cane herself)      Pertinent Vitals/Pain Pain Assessment: No/denies pain    Home Living                      Prior Function            PT Goals (current goals can now be found in the care plan section) Progress towards PT goals: Not progressing toward goals - comment    Frequency    Min 3X/week      PT Plan Discharge plan needs to be updated    Co-evaluation              AM-PAC PT "6 Clicks" Daily Activity  Outcome Measure  Difficulty turning over in bed (including adjusting bedclothes, sheets and blankets)?: Unable Difficulty moving from lying on back to sitting on the side of the bed? : Unable Difficulty sitting down on and standing up from a chair with arms (e.g., wheelchair, bedside commode, etc,.)?: Unable Help needed moving to and from a bed to chair (including a wheelchair)?: A Lot Help needed walking in hospital room?: A Lot Help needed climbing 3-5 steps with a railing? : Total 6 Click Score: 8    End of Session Equipment Utilized During Treatment: Gait belt Activity Tolerance: Patient limited by fatigue Patient left: with call bell/phone within reach;with bed alarm set;with family/visitor present;in bed   PT Visit Diagnosis: History of falling (Z91.81);Other abnormalities of gait and mobility (R26.89);Muscle weakness (generalized) (M62.81)     Time: 1435-1500 PT Time Calculation (min) (ACUTE ONLY): 25 min  Charges:  $Gait Training: 8-22 mins $Therapeutic Activity: 8-22 mins                    G CodesMagda Kiel, [place] [phone] 09/23/2017    Reginia Naas 09/23/2017, 3:31 PM

## 2017-09-23 NOTE — Progress Notes (Signed)
Pt was noted to be wheezing, SOB, sat at 89% on RA shortly starting blood transfusion. Infusion was immediately stopped. Pt was placed on O2 [place] 2L with improvement of sats with resolving increased WOB. On my exam, pt noted to be wheezing bilaterally, no sig inrceased WOB noted. Pt still able to complete full sentences. Afebrile, HR, BP wnl. IVF stopped. Pt was given IV lasix 40mg , IV benadryl and IV solumedrol. Monitor closely. Blood sent back to blood bank.

## 2017-09-23 NOTE — Progress Notes (Signed)
OT Cancellation    09/23/17 0900  OT Visit Information  Last OT Received On 09/23/17  Reason Eval/Treat Not Completed Patient at procedure or test/ unavailable (endoscopy)  [facility] OT/L  [phone] 09/23/2017

## 2017-09-23 NOTE — Anesthesia Procedure Notes (Signed)
Procedure Name: MAC Date/Time: 09/23/2017 9:40 AM Performed by: Leonor Liv, CRNA Oxygen Delivery Method: Nasal cannula Airway Equipment and Method: Bite block

## 2017-09-23 NOTE — Anesthesia Postprocedure Evaluation (Signed)
Anesthesia Post Note  Patient: [name]  Procedure(s) Performed: ESOPHAGOGASTRODUODENOSCOPY (EGD) (N/A )     Patient location during evaluation: Endoscopy Anesthesia Type: MAC Pain management: pain level controlled Vital Signs Assessment: post-procedure vital signs reviewed and stable Respiratory status: spontaneous breathing Cardiovascular status: stable Anesthetic complications: no    Last Vitals:  Vitals:   09/23/17 0844 09/23/17 1007  BP: (!) 156/70 102/72  Pulse: 71 65  Resp: (!) 25 (!) 24  Temp: 36.9 C 36.4 C  SpO2: 95% 98%    Last Pain:  Vitals:   09/23/17 1007  TempSrc: Axillary  PainSc:                  [name]

## 2017-09-23 NOTE — Transfer of Care (Signed)
Immediate Anesthesia Transfer of Care Note  Patient: [name]  Procedure(s) Performed: ESOPHAGOGASTRODUODENOSCOPY (EGD) (N/A )  Patient Location: Endoscopy Unit  Anesthesia Type:MAC  Level of Consciousness: drowsy  Airway & Oxygen Therapy: Patient Spontanous Breathing and Patient connected to nasal cannula oxygen  Post-op Assessment: Report given to RN and Post -op Vital signs reviewed and stable  Post vital signs: Reviewed and stable  Last Vitals:  Vitals:   09/23/17 0607 09/23/17 0844  BP: 130/86 (!) 156/70  Pulse: 77 71  Resp: 16 (!) 25  Temp: 36.6 C 36.9 C  SpO2: 96% 95%    Last Pain:  Vitals:   09/23/17 0844  TempSrc: Oral  PainSc:          Complications: No apparent anesthesia complications

## 2017-09-23 NOTE — Progress Notes (Signed)
PROGRESS NOTE  [name] PPI:[id number] DOB: 08-04-1923 DOA: 09/18/2017 PCP: Lawerance Cruel, MD  HPI/Recap of past 24 hours: HPI from Dr Hal Hope on 09/19/17 [name] is a 83 y.o. female with history of hypertension, possibly CHF, hypothyroidism and meningioma was brought to the ER after patient was found to have left-sided weakness.  As per the patient's daughter, was found to have slumped on her chair not moving her left upper and lower extremities.  Also mild left facial droop was seen. Patient did not lose consciousness or did not have any seizure-like activities or any incontinence of urine or tongue bite.  Patient symptoms lasted for less than 3 minutes following which patient came back to baseline. In the ER patient was appearing nonfocal. CT head was showing possible meningioma but otherwise nothing acute.  EKG was showing sinus bradycardia.  Neurologist on-call was consulted and patient admitted for further management.  Today, no gross bleeding noted, although hgb keeps dropping. Pt denies any chest pain, SOB, D/C, fever/chills.   Assessment/Plan: Active Problems:   Intracranial vascular stenosis   Essential hypertension   Cerebrovascular accident (CVA) due to occlusion of right middle cerebral artery (HCC)   Carotid occlusion, left   Stenosis of right carotid artery   Hyperlipidemia   Meningioma (HCC)   CVA (cerebral vascular accident) ([place])  #Right MCA acute infarct Pt currently stable MRI showed right MCA moderate-sized infarct CTA head neck showed left ICA chronic occlusion, right M2 occlusion corresponding to right MCA infarct CT head: Stable 2.4 cm partially calcified right parasellar mass consistent with history of meningioma  ECHO: LVEF 65-70%, moderate LVH, normal wall motion, mild calcific aortic valve stenosis, mild MR, severe LAE, trivial TR, RVSP 85mmHg, normal IVC A1c 5.1, LDL 75 Neurology consulted: Cardiac event monitor as outpt to r/o Afib,  Continue DAPT for 3 months and then plavix alone (ASA 325mg  + Plavix 75mg ) Antiplatelet therapy held due to ??GIB PT/OT/SLP: Home health PT/OT/SLP Monitor closely  #Acute drop in hgb??Upper GIB Hgb keeps down trending, currently 7.5 Resolved coffee ground emesis, elevated BUN trending down, ++drop in hgb GI on board, EGD no obvious signs of bleeding/stigmata of recent bleed. Ordered FOBT. May consider bleeding scan if Hgb keeps dropping Will transfuse 1U of PRBC today Held DAPT, lovenox due to possible GIB IV protonix IVF Daily cbc  #Hypertension Stable Restarted home coreg, but decreased dose to 12.5 mg BID Held PTA lasix, lisinopril, can gradually restart pending BP  #Hypothyroidism TSH 3.515 Continue Synthroid  #History of iron deficiency anemia Continue iron supplements  #Depression Continue antidepressant   Code Status: Full  Family Communication: None at bedside  Disposition Plan: Home once stable   Consultants:  Neurology  GI  Procedures:  EGD on 09/23/17  Antimicrobials:  None  DVT prophylaxis:  SCDs   Objective: Vitals:   09/23/17 1012 09/23/17 1013 09/23/17 1022 09/23/17 1415  BP:  (!) 89/45 (!) 121/37 (!) 143/95  Pulse: 63 64 66 73  Resp: (!) 24 17 (!) 24 20  Temp:    98.6 F (37 C)  TempSrc:    Oral  SpO2: 98% 98% 98% 99%  Weight:      Height:        Intake/Output Summary (Last 24 hours) at 09/23/2017 1523 Last data filed at 09/23/2017 0146 Gross per 24 hour  Intake -  Output 400 ml  Net -400 ml   Filed Weights   09/18/17 1813 09/18/17 2250  Weight: 55.3 kg (  122 lb) 53.4 kg (117 lb 11.6 oz)    Exam:   General: Alert, awake, oriented, not in acute distress  Cardiovascular: S1-S2 present, no added heart sounds  Respiratory: Chest clear bilaterally  Abdomen: soft, non-tender, nondistended, bowel sounds present  Musculoskeletal: No pedal edema bilaterally  Skin: Normal  Psychiatry: Normal mood   Data  Reviewed: CBC: Recent Labs  Lab 09/18/17 1921  09/19/17 0408 09/20/17 1824 09/21/17 0917 09/22/17 0546 09/23/17 0727  WBC 6.6  --  7.1 13.7* 14.1* 13.0* 12.7*  NEUTROABS 4.4  --   --  11.5* 10.8* 10.2* 9.6*  HGB 12.7   < > 11.3* 11.7* 10.1* 8.3* 7.5*  HCT 39.3   < > 35.3* 36.2 30.2* 25.1* 23.2*  MCV 97.8  --  97.8 96.8 95.0 96.9 99.1  PLT 254  --  249 310 307 267 237   < > = values in this interval not displayed.   Basic Metabolic Panel: Recent Labs  Lab 09/18/17 1921 09/18/17 1937 09/21/17 0917 09/22/17 0546 09/23/17 0727  NA 140 141 135 142 140  K 4.2 4.3 3.7 3.4* 4.0  CL 103 103 96* 107 107  CO2 29  --  28 27 25   GLUCOSE 96 92 170* 96 81  BUN 16 19 55* 52* 27*  CREATININE 0.92 1.00 1.03* 1.05* 0.93  CALCIUM 9.6  --  10.9* 9.3 8.7*   GFR: Estimated Creatinine Clearance: 28.8 mL/min (by C-G formula based on SCr of 0.93 mg/dL). Liver Function Tests: Recent Labs  Lab 09/18/17 1921  AST 23  ALT 17  ALKPHOS 145*  BILITOT 0.8  PROT 6.7  ALBUMIN 3.7   No results for input(s): LIPASE, AMYLASE in the last 168 hours. No results for input(s): AMMONIA in the last 168 hours. Coagulation Profile: Recent Labs  Lab 09/18/17 1921  INR 1.08   Cardiac Enzymes: No results for input(s): CKTOTAL, CKMB, CKMBINDEX, TROPONINI in the last 168 hours. BNP (last 3 results) No results for input(s): PROBNP in the last 8760 hours. HbA1C: No results for input(s): HGBA1C in the last 72 hours. CBG: No results for input(s): GLUCAP in the last 168 hours. Lipid Profile: No results for input(s): CHOL, HDL, LDLCALC, TRIG, CHOLHDL, LDLDIRECT in the last 72 hours. Thyroid Function Tests: No results for input(s): TSH, T4TOTAL, FREET4, T3FREE, THYROIDAB in the last 72 hours. Anemia Panel: No results for input(s): VITAMINB12, FOLATE, FERRITIN, TIBC, IRON, RETICCTPCT in the last 72 hours. Urine analysis:    Component Value Date/Time   COLORURINE STRAW (A) 09/18/2017 1824   APPEARANCEUR  CLEAR 09/18/2017 1824   LABSPEC 1.006 09/18/2017 1824   PHURINE 7.0 09/18/2017 1824   GLUCOSEU NEGATIVE 09/18/2017 1824   HGBUR NEGATIVE 09/18/2017 1824   BILIRUBINUR NEGATIVE 09/18/2017 1824   KETONESUR NEGATIVE 09/18/2017 1824   PROTEINUR NEGATIVE 09/18/2017 1824   UROBILINOGEN 0.2 08/30/2010 1430   NITRITE NEGATIVE 09/18/2017 1824   LEUKOCYTESUR NEGATIVE 09/18/2017 1824   Sepsis Labs: @LABRCNTIP (procalcitonin:4,lacticidven:4)  )No results found for this or any previous visit (from the past 240 hour(s)).    Studies: No results found.  Scheduled Meds: . ALPRAZolam  0.5 mg Oral QHS  . buPROPion  150 mg Oral BID  . ferrous sulfate  325 mg Oral BID WC  . FLUoxetine  40 mg Oral Daily  . levothyroxine  50 mcg Oral QAC breakfast  . multivitamin with minerals  1 tablet Oral Daily  . [START ON 09/24/2017] pantoprazole  40 mg Intravenous Q12H  . potassium chloride  SA  40 mEq Oral q morning - 10a  . pravastatin  20 mg Oral q1800  . timolol  1 drop Both Eyes BID    Continuous Infusions: . sodium chloride Stopped (09/22/17 1829)  . sodium chloride    . pantoprozole (PROTONIX) infusion 8 mg/hr (09/23/17 0008)     LOS: 2 days     Alma Friendly, MD [facility]   If 7PM-7AM, please contact night-coverage [email or web address] Password [facility] 09/23/2017, 3:23 PM

## 2017-09-23 NOTE — Interval H&P Note (Signed)
History and Physical Interval Note:  09/23/2017 9:29 AM  [name]  has presented today for surgery, with the diagnosis of GI bleed  The various methods of treatment have been discussed with the patient and family. After consideration of risks, benefits and other options for treatment, the patient has consented to  Procedure(s): ESOPHAGOGASTRODUODENOSCOPY (EGD) (N/A) as a surgical intervention .  The patient's history has been reviewed, patient examined, no change in status, stable for surgery.  I have reviewed the patient's chart and labs.  Questions were answered to the patient's satisfaction.     Nancy Fetter

## 2017-09-23 NOTE — NC FL2 (Signed)
[place] MEDICAID FL2 LEVEL OF CARE SCREENING TOOL     IDENTIFICATION  Patient Name: [name] Birthdate: [birth year] Sex: female Admission Date (Current Location): 09/18/2017  [facility] and [place] Number:  Herbalist and Address:  [facility], [place] [address] [place], [place] [address]      Provider Number: [id number]  Attending Physician Name and Address:  Alma Friendly, MD  Relative Name and Phone Number:       Current Level of Care: Hospital Recommended Level of Care: [place] Prior Approval Number:    Date Approved/Denied:   PASRR Number: Pending; manual review  Discharge Plan: SNF    Current Diagnoses: Patient Active Problem List   Diagnosis Date Noted  . CVA (cerebral vascular accident) ([place]) 09/21/2017  . Essential hypertension 09/19/2017  . Cerebrovascular accident (CVA) due to occlusion of right middle cerebral artery ([place])   . Carotid occlusion, left   . Stenosis of right carotid artery   . Hyperlipidemia   . Meningioma ([place])   . Intracranial vascular stenosis 09/18/2017  . Abdominal wall hernia-repair with physiomesh 12/05/2011    Orientation RESPIRATION BLADDER Height & Weight     Self, Time, Place  Normal Incontinent Weight: 117 lb 11.6 oz (53.4 kg) Height:  4\' 11"  (149.9 cm)  BEHAVIORAL SYMPTOMS/MOOD NEUROLOGICAL BOWEL NUTRITION STATUS      Continent Diet(heart healthy)  AMBULATORY STATUS COMMUNICATION OF NEEDS Skin   Limited Assist Verbally Normal                       Personal Care Assistance Level of Assistance  Bathing, Feeding, Dressing Bathing Assistance: Limited assistance Feeding assistance: Independent Dressing Assistance: Limited assistance     Functional Limitations Info  Sight, Hearing, Speech Sight Info: Adequate Hearing Info: Adequate Speech Info: Impaired(slurred)    SPECIAL CARE FACTORS FREQUENCY  PT (By licensed PT), OT (By licensed OT)     PT  Frequency: 5x/wk OT Frequency: 5x/wk            Contractures Contractures Info: Not present    Additional Factors Info  Code Status, Allergies, Psychotropic Code Status Info: Full Allergies Info: NKA Psychotropic Info: Xanax 0.5mg  daily at bed; Wellbutrin 150mg  2x/day; Prozac 40mg  daily         Current Medications (09/23/2017):  This is the current hospital active medication list Current Facility-Administered Medications  Medication Dose Route Frequency Provider Last Rate Last Dose  . 0.9 %  sodium chloride infusion   Intravenous Continuous Alma Friendly, MD   Stopped at 09/22/17 1829  . 0.9 %  sodium chloride infusion   Intravenous Once Alma Friendly, MD      . acetaminophen (TYLENOL) tablet 650 mg  650 mg Oral Q4H PRN Rise Patience, MD       Or  . acetaminophen (TYLENOL) solution 650 mg  650 mg Per Tube Q4H PRN Rise Patience, MD       Or  . acetaminophen (TYLENOL) suppository 650 mg  650 mg Rectal Q4H PRN Rise Patience, MD      . ALPRAZolam Duanne Moron) tablet 0.5 mg  0.5 mg Oral QHS Rise Patience, MD   0.5 mg at 09/22/17 2217  . buPROPion (WELLBUTRIN SR) 12 hr tablet 150 mg  150 mg Oral BID Rise Patience, MD   150 mg at 09/23/17 1231  . carvedilol (COREG) tablet 12.5 mg  12.5 mg Oral BID WC  Alma Friendly, MD      . fentaNYL (SUBLIMAZE) injection 25-50 mcg  25-50 mcg Intravenous Q5 min PRN Belinda Block, MD      . ferrous sulfate tablet 325 mg  325 mg Oral BID WC Rise Patience, MD   325 mg at 09/22/17 1702  . FLUoxetine (PROZAC) capsule 40 mg  40 mg Oral Daily Rise Patience, MD   40 mg at 09/23/17 1231  . levothyroxine (SYNTHROID, LEVOTHROID) tablet 50 mcg  50 mcg Oral QAC breakfast Rise Patience, MD   50 mcg at 09/23/17 0603  . multivitamin with minerals tablet 1 tablet  1 tablet Oral Daily Rise Patience, MD   1 tablet at 09/23/17 1232  . ondansetron (ZOFRAN-ODT) disintegrating tablet 4 mg  4 mg  Oral Q8H PRN Nita Sells, MD   4 mg at 09/22/17 1845  . pantoprazole (PROTONIX) 80 mg in sodium chloride 0.9 % 250 mL (0.32 mg/mL) infusion  8 mg/hr Intravenous Continuous Alma Friendly, MD 25 mL/hr at 09/23/17 0008 8 mg/hr at 09/23/17 0008  . [START ON 09/24/2017] pantoprazole (PROTONIX) injection 40 mg  40 mg Intravenous Q12H Alma Friendly, MD      . potassium chloride SA (K-DUR,KLOR-CON) CR tablet 40 mEq  40 mEq Oral q morning - 10a Rise Patience, MD   40 mEq at 09/23/17 1231  . pravastatin (PRAVACHOL) tablet 20 mg  20 mg Oral q1800 Rosalin Hawking, MD   20 mg at 09/22/17 1702  . timolol (TIMOPTIC) 0.5 % ophthalmic solution 1 drop  1 drop Both Eyes BID Rise Patience, MD   1 drop at 09/22/17 2217     Discharge Medications: Please see discharge summary for a list of discharge medications.  Relevant Imaging Results:  Relevant Lab Results:   Additional Information SS#: [id number]  Geralynn Ochs, LCSW

## 2017-09-24 LAB — BPAM RBC
BLOOD PRODUCT EXPIRATION DATE: 201901312359
Blood Product Expiration Date: 201901312359
ISSUE DATE / TIME: 201901021720
ISSUE DATE / TIME: 201901030121
UNIT TYPE AND RH: 5100
Unit Type and Rh: 5100

## 2017-09-24 LAB — TYPE AND SCREEN
ABO/RH(D): O POS
Antibody Screen: NEGATIVE
UNIT DIVISION: 0
Unit division: 0

## 2017-09-24 LAB — CBC WITH DIFFERENTIAL/PLATELET
Basophils Absolute: 0 10*3/uL (ref 0.0–0.1)
Basophils Relative: 0 %
EOS ABS: 0 10*3/uL (ref 0.0–0.7)
EOS PCT: 0 %
HCT: 25.7 % — ABNORMAL LOW (ref 36.0–46.0)
HEMOGLOBIN: 8.2 g/dL — AB (ref 12.0–15.0)
LYMPHS ABS: 0.5 10*3/uL — AB (ref 0.7–4.0)
LYMPHS PCT: 6 %
MCH: 31.4 pg (ref 26.0–34.0)
MCHC: 31.9 g/dL (ref 30.0–36.0)
MCV: 98.5 fL (ref 78.0–100.0)
MONOS PCT: 1 %
Monocytes Absolute: 0.1 10*3/uL (ref 0.1–1.0)
Neutro Abs: 8.2 10*3/uL — ABNORMAL HIGH (ref 1.7–7.7)
Neutrophils Relative %: 93 %
Platelets: 247 10*3/uL (ref 150–400)
RBC: 2.61 MIL/uL — ABNORMAL LOW (ref 3.87–5.11)
RDW: 14.4 % (ref 11.5–15.5)
WBC: 8.8 10*3/uL (ref 4.0–10.5)

## 2017-09-24 LAB — TRANSFUSION REACTION
DAT C3: NEGATIVE
POST RXN DAT IGG: NEGATIVE

## 2017-09-24 LAB — BASIC METABOLIC PANEL
Anion gap: 7 (ref 5–15)
BUN: 21 mg/dL — AB (ref 6–20)
CHLORIDE: 105 mmol/L (ref 101–111)
CO2: 25 mmol/L (ref 22–32)
Calcium: 8.7 mg/dL — ABNORMAL LOW (ref 8.9–10.3)
Creatinine, Ser: 0.94 mg/dL (ref 0.44–1.00)
GFR calc Af Amer: 59 mL/min — ABNORMAL LOW (ref >60)
GFR calc non Af Amer: 51 mL/min — ABNORMAL LOW (ref >60)
Glucose, Bld: 157 mg/dL — ABNORMAL HIGH (ref 65–99)
POTASSIUM: 3.7 mmol/L (ref 3.5–5.1)
Sodium: 137 mmol/L (ref 135–145)

## 2017-09-24 NOTE — Progress Notes (Signed)
PROGRESS NOTE  [name] DPO:[id number] DOB: Feb 19, 1923 DOA: 09/18/2017 PCP: Lawerance Cruel, MD  HPI/Recap of past 24 hours:  89 fem htn, chf, hypothyroid, meningioma-came to ED 2/2 L side weak, L facial droop Admit 12/29 with Possible CVA.  Patient symptoms lasted for less than 3 minutes following which patient came back to baseline. In the ER patient was appearing nonfocal. CT head was showing possible meningioma but otherwise nothing acute.  EKG was showing sinus bradycardia.  Neurologist on-call was consulted and patient admitted for further management.  Today, no gross bleeding noted, although hgb keeps dropping. Pt denies any chest pain, SOB, D/C, fever/chills.   Assessment/Plan: Active Problems:   Intracranial vascular stenosis   Essential hypertension   Cerebrovascular accident (CVA) due to occlusion of right middle cerebral artery (HCC)   Carotid occlusion, left   Stenosis of right carotid artery   Hyperlipidemia   Meningioma (HCC)   CVA (cerebral vascular accident) ([place])  #Right MCA acute infarct Pt currently stable MRI = right MCA moderate-sized infarct CTA head neck showed chr left ICA chronic occlusion, right M2 occlusion corresponding to right MCA infarct CT head: Stable 2.4 cm partially calcified right parasellar mass consistent with history of meningioma  ECHO: LVEF 65-70%, moderate LVH, normal wall motion, mild calcific aortic valve stenosis, mild MR, severe LAE, trivial TR, RVSP 41mmHg, normal IVC A1c 5.1, LDL 75 Neurology rec's: Cardiac event monitor as outpt to r/o Afib, Continue DAPT for 3 months and then plavix alone (ASA 325mg  + Plavix 75mg ) Antiplatelet therapy held due to ??GIB-d/w Dr Oletta Lamas and okays re-start ASA at full dose in maybe 10 days or so PT/OT/SLP: Home health PT/OT/SLP Monitor closely  #Acute drop in hgb??Upper GIB Hb is~ 8 and is stable Resolved coffee ground emesis, elevated BUN trending down, ++drop in hgb GI on board, EGD no  obvious signs of bleeding/stigmata of recent bleed. Ordered FOBT. May consider bleeding scan if Hgb keeps dropping Will transfuse 1U of PRBC today--could not transfuse as patient became acutely SOB Held DAPT, lovenox due to possible GIB Continue IV protonix--hemoglobin stable in 8 range currently with no evidence of bleed Agree with checking iron levels--ok with IV iron in the am if is low  #Hypertension Stable Restarted home coreg, but decreased dose to 12.5 mg BID Held PTA lasix, lisinopril, can gradually restart pending BP  #Hypothyroidism TSH 3.515 Continue Synthroid  #History of iron deficiency anemia Continue iron supplements  #Depression Continue antidepressant   Code Status: Full  Family Communication: None at bedside  Disposition Plan: Home once stable   Consultants:  Neurology  GI  Procedures:  EGD on 09/23/17  Antimicrobials:  None  DVT prophylaxis:  SCDs   Objective: Vitals:   09/23/17 1711 09/23/17 2130 09/24/17 0142 09/24/17 0500  BP: (!) 194/74 (!) 121/47 132/88 (!) 153/67  Pulse: 77 64 67 67  Resp: 20 20 18    Temp: 99.6 F (37.6 C) 98.8 F (37.1 C) 98.7 F (37.1 C) 98.1 F (36.7 C)  TempSrc: Oral Axillary Axillary Axillary  SpO2: 100% 100% 100% 100%  Weight:      Height:        Intake/Output Summary (Last 24 hours) at 09/24/2017 0958 Last data filed at 09/24/2017 0900 Gross per 24 hour  Intake 1175 ml  Output 350 ml  Net 825 ml   Filed Weights   09/18/17 1813 09/18/17 2250  Weight: 55.3 kg (122 lb) 53.4 kg (117 lb 11.6 oz)    Exam:  Awake alert in nad No pallor no ict cta b no added sound, abd soft nt nd no rebound no guard Neuro intact smile symm moving 4 limbs equally-power 5/5   Data Reviewed: CBC: Recent Labs  Lab 09/20/17 1824 09/21/17 0917 09/22/17 0546 09/23/17 0727 09/24/17 0557  WBC 13.7* 14.1* 13.0* 12.7* 8.8  NEUTROABS 11.5* 10.8* 10.2* 9.6* 8.2*  HGB 11.7* 10.1* 8.3* 7.5* 8.2*  HCT 36.2 30.2* 25.1*  23.2* 25.7*  MCV 96.8 95.0 96.9 99.1 98.5  PLT 310 307 267 237 324   Basic Metabolic Panel: Recent Labs  Lab 09/18/17 1921 09/18/17 1937 09/21/17 0917 09/22/17 0546 09/23/17 0727 09/24/17 0557  NA 140 141 135 142 140 137  K 4.2 4.3 3.7 3.4* 4.0 3.7  CL 103 103 96* 107 107 105  CO2 29  --  28 27 25 25   GLUCOSE 96 92 170* 96 81 157*  BUN 16 19 55* 52* 27* 21*  CREATININE 0.92 1.00 1.03* 1.05* 0.93 0.94  CALCIUM 9.6  --  10.9* 9.3 8.7* 8.7*   GFR: Estimated Creatinine Clearance: 28.5 mL/min (by C-G formula based on SCr of 0.94 mg/dL). Liver Function Tests: Recent Labs  Lab 09/18/17 1921  AST 23  ALT 17  ALKPHOS 145*  BILITOT 0.8  PROT 6.7  ALBUMIN 3.7   No results for input(s): LIPASE, AMYLASE in the last 168 hours. No results for input(s): AMMONIA in the last 168 hours. Coagulation Profile: Recent Labs  Lab 09/18/17 1921  INR 1.08   Cardiac Enzymes: No results for input(s): CKTOTAL, CKMB, CKMBINDEX, TROPONINI in the last 168 hours. BNP (last 3 results) No results for input(s): PROBNP in the last 8760 hours. HbA1C: No results for input(s): HGBA1C in the last 72 hours. CBG: No results for input(s): GLUCAP in the last 168 hours. Lipid Profile: No results for input(s): CHOL, HDL, LDLCALC, TRIG, CHOLHDL, LDLDIRECT in the last 72 hours. Thyroid Function Tests: No results for input(s): TSH, T4TOTAL, FREET4, T3FREE, THYROIDAB in the last 72 hours. Anemia Panel: No results for input(s): VITAMINB12, FOLATE, FERRITIN, TIBC, IRON, RETICCTPCT in the last 72 hours. Urine analysis:    Component Value Date/Time   COLORURINE YELLOW 09/23/2017 2008   APPEARANCEUR HAZY (A) 09/23/2017 2008   LABSPEC 1.010 09/23/2017 2008   PHURINE 6.0 09/23/2017 2008   GLUCOSEU NEGATIVE 09/23/2017 2008   HGBUR SMALL (A) 09/23/2017 2008   BILIRUBINUR NEGATIVE 09/23/2017 2008   KETONESUR NEGATIVE 09/23/2017 2008   PROTEINUR NEGATIVE 09/23/2017 2008   UROBILINOGEN 0.2 08/30/2010 1430    NITRITE NEGATIVE 09/23/2017 2008   LEUKOCYTESUR SMALL (A) 09/23/2017 2008   Sepsis Labs: @LABRCNTIP (procalcitonin:4,lacticidven:4)  )No results found for this or any previous visit (from the past 240 hour(s)).    Studies: Dg Chest Port 1 View  Result Date: 09/23/2017 CLINICAL DATA:  Shortness of breath EXAM: PORTABLE CHEST 1 VIEW COMPARISON:  Chest x-ray dated 09/18/2017. FINDINGS: Increased perihilar and bibasilar opacities, likely representing an combination of pulmonary edema and bilateral pleural effusions. Large hiatal hernia, as demonstrated on earlier abdomen CT of 03/05/2016. Heart size and mediastinal contours are stable. Atherosclerotic changes noted at the aortic arch. Chronic deformity/dislocation of the right humeral head. No acute or suspicious osseous finding. IMPRESSION: 1. New perihilar and bibasilar opacities, most suggestive of a combination of pulmonary edema and bilateral pleural effusions. 2. Stable cardiomegaly. 3. Large hiatal hernia. 4. Aortic atherosclerosis. Electronically Signed   By: Franki Cabot M.D.   On: 09/23/2017 18:45    Scheduled Meds: .  ALPRAZolam  0.5 mg Oral QHS  . buPROPion  150 mg Oral BID  . carvedilol  12.5 mg Oral BID WC  . ferrous sulfate  325 mg Oral BID WC  . FLUoxetine  40 mg Oral Daily  . levothyroxine  50 mcg Oral QAC breakfast  . multivitamin with minerals  1 tablet Oral Daily  . pantoprazole  40 mg Intravenous Q12H  . potassium chloride SA  40 mEq Oral q morning - 10a  . pravastatin  20 mg Oral q1800  . timolol  1 drop Both Eyes BID    Continuous Infusions: . sodium chloride    . pantoprozole (PROTONIX) infusion 8 mg/hr (09/23/17 0008)     LOS: 3 days     Nita Sells, MD [facility]   If 7PM-7AM, please contact night-coverage [email or web address] Password [email or web address] 09/24/2017, 9:58 AM

## 2017-09-24 NOTE — Plan of Care (Signed)
Care plan reviewed.

## 2017-09-24 NOTE — Progress Notes (Signed)
Occupational Therapy Treatment Patient Details Name: [name] MRN: [id number] DOB: Feb 15, 1923 Today's Date: 09/24/2017    History of present illness [name] is a 84 y.o. female with history of hypertension, possibly CHF, hypothyroidism and meningioma was brought to the ER after patient was found to have left-sided weakness. CT negative for acute changes, showed meningioma.  PMH positive for anemia, arthritis and HTN.  MRI positive for Acute/subacute nonhemorrhagic infarct involving the anterior right insular ribbon and right frontal operculum..   OT comments  Pt demonstrates a significant decline in function since evaluation, requiring max A with mobility and ADL. Pt will need rehab at SNF. Will continue to follow acutely to maximize functional level of independence and facilitate DC to SNF.   Follow Up Recommendations  SNF;Supervision/Assistance - 24 hour    Equipment Recommendations  None recommended by OT    Recommendations for Other Services      Precautions / Restrictions Precautions Precautions: Fall Restrictions Weight Bearing Restrictions: No       Mobility Bed Mobility Overal bed mobility: Needs Assistance       Supine to sit: Mod assist        Transfers     Transfers: Sit to/from Stand;Stand Pivot Transfers Sit to Stand: Max assist Stand pivot transfers: Max assist            Balance Overall balance assessment: History of Falls   Sitting balance-Leahy Scale: Fair       Standing balance-Leahy Scale: Poor                             ADL either performed or assessed with clinical judgement   ADL Overall ADL's : Needs assistance/impaired     Grooming: Minimal assistance;Sitting   Upper Body Bathing: Minimal assistance;Sitting   Lower Body Bathing: Maximal assistance;Sit to/from stand   Upper Body Dressing : Moderate assistance;Sitting   Lower Body Dressing: Maximal assistance;Sit to/from stand   Toilet Transfer:  Maximal assistance;Stand-pivot   Toileting- Clothing Manipulation and Hygiene: Maximal assistance       Functional mobility during ADLs: Maximal assistance General ADL Comments: decline in function since last visit     Vision   Vision Assessment?: Vision impaired- to be further tested in functional context Additional Comments: wears glasses   Perception     Praxis      Cognition Arousal/Alertness: Lethargic Behavior During Therapy: WFL for tasks assessed/performed Overall Cognitive Status: Impaired/Different from baseline Area of Impairment: Attention;Orientation;Memory;Safety/judgement;Awareness;Problem solving                 Orientation Level: Disoriented to;Time;Situation Current Attention Level: Sustained Memory: Decreased short-term memory Following Commands: Follows one step commands with increased time Safety/Judgement: Decreased awareness of safety;Decreased awareness of deficits Awareness: Emergent Problem Solving: Slow processing;Decreased initiation;Difficulty sequencing;Requires verbal cues          Exercises     Shoulder Instructions       General Comments      Pertinent Vitals/ Pain       Pain Assessment: Faces Faces Pain Scale: Hurts a little bit Pain Location: shoulders Pain Descriptors / Indicators: Discomfort Pain Intervention(s): Limited activity within patient's tolerance  Home Living                                          Prior Functioning/Environment  Frequency  Min 2X/week        Progress Toward Goals  OT Goals(current goals can now be found in the care plan section)  Progress towards OT goals: Not progressing toward goals - comment;Goals drowngraded-see care plan  Acute Rehab OT Goals Patient Stated Goal: get stronger OT Goal Formulation: With patient/family Time For Goal Achievement: 10/04/16 Potential to Achieve Goals: Good ADL Goals Pt Will Perform Upper Body Bathing: with  set-up;with supervision Pt Will Perform Lower Body Bathing: with min assist;sit to/from stand Pt Will Transfer to Toilet: with modified independence;ambulating;grab bars Additional ADL Goal #1: Pt/daughter will independently verbalize 3 strategies to reduce risk of falls at home.  Plan Discharge plan needs to be updated    Co-evaluation                 AM-PAC PT "6 Clicks" Daily Activity     Outcome Measure   Help from another person eating meals?: None Help from another person taking care of personal grooming?: A Little Help from another person toileting, which includes using toliet, bedpan, or urinal?: A Lot Help from another person bathing (including washing, rinsing, drying)?: A Lot Help from another person to put on and taking off regular upper body clothing?: A Lot Help from another person to put on and taking off regular lower body clothing?: A Lot 6 Click Score: 15    End of Session Equipment Utilized During Treatment: Gait belt  OT Visit Diagnosis: Unsteadiness on feet (R26.81);History of falling (Z91.81)   Activity Tolerance Patient tolerated treatment well   Patient Left in chair;with call bell/phone within reach;with chair alarm set   Nurse Communication Mobility status;Need for lift equipment(us eof stedy)        Time: 7793-9030 OT Time Calculation (min): 19 min  Charges: OT General Charges $OT Visit: 1 Visit OT Treatments $Self Care/Home Management : 8-22 mins  [facility], OT/L  [phone] 09/24/2017   Monick Rena,HILLARY 09/24/2017, 11:32 AM

## 2017-09-24 NOTE — Progress Notes (Signed)
[facility] PROGRESS NOTE Subjective The patient without any gross bleeding.  Had 1 unit of blood ordered yesterday but the transfusion had to be terminated due to a severe reaction requiring IV Lasix and steroids and Benadryl.  She has not had any further bleeding this been obvious and hemoglobin has come up a little to 8.2 since yesterday.  Patient has been complaining of some wheezing shortness of breath  Objective: Vital signs in last 24 hours: Temp:  [97.6 F (36.4 C)-99.6 F (37.6 C)] 98.1 F (36.7 C) (01/03 0500) Pulse Rate:  [63-77] 67 (01/03 0500) Resp:  [17-24] 18 (01/03 0142) BP: (89-194)/(37-95) 153/67 (01/03 0500) SpO2:  [98 %-100 %] 100 % (01/03 0500) Last BM Date: 09/23/17  Intake/Output from previous day: 01/02 0701 - 01/03 0700 In: 815 [I.V.:500; Blood:315] Out: 350 [Urine:350] Intake/Output this shift: Total I/O In: 360 [P.O.:360] Out: -   PE: General--no obvious distress eating breakfast - Lungs--scattered wheezing bilaterally Abdomen--nontender  Lab Results: Recent Labs    09/22/17 0546 09/23/17 0727 09/24/17 0557  WBC 13.0* 12.7* 8.8  HGB 8.3* 7.5* 8.2*  HCT 25.1* 23.2* 25.7*  PLT 267 237 247   BMET Recent Labs    09/22/17 0546 09/23/17 0727 09/24/17 0557  NA 142 140 137  K 3.4* 4.0 3.7  CL 107 107 105  CO2 27 25 25   CREATININE 1.05* 0.93 0.94   LFT No results for input(s): PROT, AST, ALT, ALKPHOS, BILITOT, BILIDIR, IBILI in the last 72 hours. PT/INR No results for input(s): LABPROT, INR in the last 72 hours. PANCREAS No results for input(s): LIPASE in the last 72 hours.       Studies/Results: Dg Chest Port 1 View  Result Date: 09/23/2017 CLINICAL DATA:  Shortness of breath EXAM: PORTABLE CHEST 1 VIEW COMPARISON:  Chest x-ray dated 09/18/2017. FINDINGS: Increased perihilar and bibasilar opacities, likely representing an combination of pulmonary edema and bilateral pleural effusions. Large hiatal hernia, as  demonstrated on earlier abdomen CT of 03/05/2016. Heart size and mediastinal contours are stable. Atherosclerotic changes noted at the aortic arch. Chronic deformity/dislocation of the right humeral head. No acute or suspicious osseous finding. IMPRESSION: 1. New perihilar and bibasilar opacities, most suggestive of a combination of pulmonary edema and bilateral pleural effusions. 2. Stable cardiomegaly. 3. Large hiatal hernia. 4. Aortic atherosclerosis. Electronically Signed   By: Franki Cabot M.D.   On: 09/23/2017 18:45    Medications: I have reviewed the patient's current medications.  Assessment:   1.  Anemia.  EGD basically negative yesterday.  Patient had reaction to blood and this had to be terminated.Unable to find iron level since admission so I went ahead and order that for tomorrow.  I am hesitant to do a colonoscopy in this 84 year old who seems quite tenuous medically. 2.  History of CVA with marked occlusion of the carotid arteries  Plan: 1.  We will check iron panel and CBC in the morning and if iron level is low I would suggest giving IV iron.  Giving her IV iron chronically may be the best choice in this 84 year old woman with multiple health problems.   Nancy Fetter 09/24/2017, 9:29 AM  This note was created using voice recognition software. Minor errors may Have occurred unintentionally.  Pager: [phone] If no answer or after hours call [phone]

## 2017-09-25 ENCOUNTER — Inpatient Hospital Stay (HOSPITAL_COMMUNITY): Payer: Medicare Other

## 2017-09-25 ENCOUNTER — Encounter (HOSPITAL_COMMUNITY): Payer: Self-pay | Admitting: Gastroenterology

## 2017-09-25 LAB — IRON AND TIBC
IRON: 18 ug/dL — AB (ref 28–170)
SATURATION RATIOS: 9 % — AB (ref 10.4–31.8)
TIBC: 211 ug/dL — AB (ref 250–450)
UIBC: 193 ug/dL

## 2017-09-25 LAB — BASIC METABOLIC PANEL
ANION GAP: 7 (ref 5–15)
BUN: 24 mg/dL — ABNORMAL HIGH (ref 6–20)
CALCIUM: 8.9 mg/dL (ref 8.9–10.3)
CHLORIDE: 105 mmol/L (ref 101–111)
CO2: 25 mmol/L (ref 22–32)
CREATININE: 0.93 mg/dL (ref 0.44–1.00)
GFR calc Af Amer: 60 mL/min — ABNORMAL LOW (ref >60)
GFR calc non Af Amer: 52 mL/min — ABNORMAL LOW (ref >60)
Glucose, Bld: 124 mg/dL — ABNORMAL HIGH (ref 65–99)
Potassium: 3.6 mmol/L (ref 3.5–5.1)
SODIUM: 137 mmol/L (ref 135–145)

## 2017-09-25 LAB — FERRITIN: FERRITIN: 211 ng/mL (ref 11–307)

## 2017-09-25 MED ORDER — SODIUM CHLORIDE 0.9 % IV SOLN
125.0000 mg | Freq: Once | INTRAVENOUS | Status: AC
  Administered 2017-09-25: 125 mg via INTRAVENOUS
  Filled 2017-09-25: qty 10

## 2017-09-25 MED ORDER — PANTOPRAZOLE SODIUM 40 MG PO TBEC
40.0000 mg | DELAYED_RELEASE_TABLET | Freq: Two times a day (BID) | ORAL | Status: DC
  Administered 2017-09-25 – 2017-09-26 (×4): 40 mg via ORAL
  Filled 2017-09-25 (×5): qty 1

## 2017-09-25 NOTE — Care Management Important Message (Signed)
Important Message  Patient Details  Name: [name] MRN: [id number] Date of Birth: May 12, 1923   Medicare Important Message Given:  Yes    Nathen May 09/25/2017, 11:26 AM

## 2017-09-25 NOTE — Progress Notes (Signed)
CSW spoke with [facility] earlier today, and the facility will not have any beds available until sometime next week. CSW provided information to patient's daughter, who requested information on other bed offers that weren't as close to home. CSW emailed list.  Patient's daughter called back and requested [facility] again. CSW contacted, and [facility] said they're not sure if they'll have a bed available until Sunday.   Patient will also require prior authorization from [facility] Medicare prior to discharge; unsure if insurance will be available over the weekend for authorization, and facilities submit electronic authorization request. Patient cannot admit to SNF without prior approval from insurance.  CSW will continue to follow.  Laveda Abbe, [place] Clinical Social Worker [phone]

## 2017-09-25 NOTE — Progress Notes (Signed)
Physical Therapy Treatment Patient Details Name: [name] MRN: [id number] DOB: [birth year] Today's Date: 09/25/2017    History of Present Illness [name] is a 84 y.o. female with history of hypertension, possibly CHF, hypothyroidism and meningioma was brought to the ER after patient was found to have left-sided weakness. CT negative for acute changes, showed meningioma.  PMH positive for anemia, arthritis and HTN.  MRI positive for Acute/subacute nonhemorrhagic infarct involving the anterior right insular ribbon and right frontal operculum..    PT Comments    Pt performed increased activity but remains to require mod assist for functional mobility.  Plan remains appropriate for SNF.  Pt continues to fatigue quickly and present with cognitive deficits.    Follow Up Recommendations  SNF;Supervision/Assistance - 24 hour     Equipment Recommendations  Other (comment)(transport chair)    Recommendations for Other Services       Precautions / Restrictions Precautions Precautions: Fall Precaution Comments: reports multiple falls at home Restrictions Weight Bearing Restrictions: No    Mobility  Bed Mobility Overal bed mobility: Needs Assistance Bed Mobility: Supine to Sit     Supine to sit: Mod assist     General bed mobility comments: Pt required assistance for LE advancement and trunk elevation into sitting.  Pt required increased time and effort.  Kyphotic posturing sitting edge of bed with slight posterior lean.    Transfers Overall transfer level: Needs assistance Equipment used: Rolling walker (2 wheeled) Transfers: Sit to/from Stand Sit to Stand: Mod assist         General transfer comment: Cues for hand placement to and from seated surface.  Pt required cues for forward gaze once in standing.  Pt requires increased time and tactile guidance to reach back for seated surface.    Ambulation/Gait Ambulation/Gait assistance: Min assist;Mod assist(min  assistance initially as patient fatigues she requires mod assist for RW advancement and support.  ) Ambulation Distance (Feet): 50 Feet Assistive device: Rolling walker (2 wheeled) Gait Pattern/deviations: Decreased stride length;Shuffle;Decreased step length - left;Wide base of support;Trunk flexed   Gait velocity interpretation: Below normal speed for age/gender General Gait Details: assistance for balance RW advancement.  Cues for forward gaze and posture in halls.  Pt will benefit from youth RW next session.  Pt remains to present with noticeable wheezing.  Pt fatigues quickly.     Stairs            Wheelchair Mobility    Modified Rankin (Stroke Patients Only)       Balance     Sitting balance-Leahy Scale: Poor       Standing balance-Leahy Scale: Poor                              Cognition Arousal/Alertness: Awake/alert Behavior During Therapy: WFL for tasks assessed/performed Overall Cognitive Status: Impaired/Different from baseline Area of Impairment: Attention;Orientation;Memory;Safety/judgement;Awareness;Problem solving                 Orientation Level: Disoriented to;Time;Situation Current Attention Level: Sustained Memory: Decreased short-term memory Following Commands: Follows one step commands with increased time Safety/Judgement: Decreased awareness of safety;Decreased awareness of deficits Awareness: Emergent Problem Solving: Slow processing;Decreased initiation;Difficulty sequencing;Requires verbal cues        Exercises      General Comments        Pertinent Vitals/Pain Pain Assessment: 0-10 Faces Pain Scale: Hurts a little bit Pain Location: shoulders Pain Descriptors /  Indicators: Discomfort Pain Intervention(s): Monitored during session;Repositioned    Home Living                      Prior Function            PT Goals (current goals can now be found in the care plan section) Acute Rehab PT  Goals Patient Stated Goal: get stronger Potential to Achieve Goals: Fair Progress towards PT goals: Progressing toward goals    Frequency    Min 3X/week      PT Plan Current plan remains appropriate    Co-evaluation              AM-PAC PT "6 Clicks" Daily Activity  Outcome Measure  Difficulty turning over in bed (including adjusting bedclothes, sheets and blankets)?: Unable Difficulty moving from lying on back to sitting on the side of the bed? : Unable Difficulty sitting down on and standing up from a chair with arms (e.g., wheelchair, bedside commode, etc,.)?: Unable Help needed moving to and from a bed to chair (including a wheelchair)?: A Lot Help needed walking in hospital room?: A Lot Help needed climbing 3-5 steps with a railing? : Total 6 Click Score: 8    End of Session Equipment Utilized During Treatment: Gait belt Activity Tolerance: Patient limited by fatigue Patient left: with call bell/phone within reach;with bed alarm set;with family/visitor present;in bed   PT Visit Diagnosis: History of falling (Z91.81);Other abnormalities of gait and mobility (R26.89);Muscle weakness (generalized) (M62.81)     Time: 3845-3646 PT Time Calculation (min) (ACUTE ONLY): 24 min  Charges:  $Gait Training: 8-22 mins $Therapeutic Activity: 8-22 mins                    G Codes:       Governor Rooks, PTA pager [phone]    Cristela Blue 09/25/2017, 12:09 PM

## 2017-09-25 NOTE — Progress Notes (Signed)
PROGRESS NOTE  DENIKA KRONE VHQ:[id number] DOB: 08-Sep-1923 DOA: 09/18/2017 PCP: Lawerance Cruel, MD  HPI/Recap of past 24 hours:  89 fem htn, chf, hypothyroid, meningioma-came to ED 2/2 L side weak, L facial droop Admit 12/29 with Possible CVA.  Patient symptoms lasted for less than 3 minutes following which patient came back to baseline. In the ER patient was appearing nonfocal. CT head was showing possible meningioma but otherwise nothing acute.  EKG was showing sinus bradycardia.  Neurologist on-call was consulted and patient admitted for further management.   [place] more awake alert in nad no distress eating Reports phlegm-overall other wsie improved  Assessment/Plan: Active Problems:   Intracranial vascular stenosis   Essential hypertension   Cerebrovascular accident (CVA) due to occlusion of right middle cerebral artery (HCC)   Carotid occlusion, left   Stenosis of right carotid artery   Hyperlipidemia   Meningioma (HCC)   CVA (cerebral vascular accident) ([place])  #Right MCA acute infarct Pt currently stable MRI = right MCA moderate-sized infarct CTA head neck showed chr left ICA chronic occlusion, right M2 occlusion corresponding to right MCA infarct CT head: Stable 2.4 cm partially calcified right parasellar mass consistent with history of meningioma  ECHO: LVEF 65-70%, moderate LVH, normal wall motion, mild calcific aortic valve stenosis, mild MR, severe LAE, trivial TR, RVSP 30mmHg, normal IVC A1c 5.1, LDL 75 Neurology rec's: Cardiac event monitor as outpt to r/o Afib, Continue DAPT for 3 months and then plavix alone (ASA 325mg  + Plavix 75mg ) Antiplatelet therapy held due to ??GIB-d/w Dr Oletta Lamas and okays re-start ASA at full dose in maybe 10 days or so---Will order DG enema with contrast to evaluate gorss large lesion in colon--if neg can send home 1/5 PT/OT/SLP: Home health PT/OT/SLP   #Acute drop in hgb??Upper GIB Hb is~ 8 and is stable Resolved coffee  ground emesis, elevated BUN trending down, ++drop in hgb GI on board, EGD no obvious signs of bleeding/stigmata of recent bleed. Ordered FOBT. May consider bleeding scan if Hgb keeps dropping Will transfuse 1U of PRBC today--could not transfuse as patient became acutely SOB Held DAPT, lovenox due to possible GIB Continue IV protonix--hemoglobin stable in 8 range currently with no evidence of bleed Iron 18, Tsat 9--giving ferreheme  #Hypertension Stable Restarted home coreg, but decreased dose to 12.5 mg BID Held PTA lasix, lisinopril, can gradually restart pending BP  #Hypothyroidism TSH 3.515 Continue Synthroid  #History of iron deficiency anemia Continue iron supplements  #Depression Continue antidepressant   Code Status: Full  Family Communication: None at bedside  Disposition Plan: Home once stable   Consultants:  Neurology  GI  Procedures:  EGD on 09/23/17  Antimicrobials:  None  DVT prophylaxis:  SCDs   Objective: Vitals:   09/24/17 2033 09/25/17 0202 09/25/17 0500 09/25/17 0954  BP: (!) 129/45 (!) 127/43 (!) 127/59 (!) 159/51  Pulse:  60 64 (!) 59  Resp: 18 18 18 18   Temp: 98.2 F (36.8 C) 98 F (36.7 C) 98.4 F (36.9 C) 98.9 F (37.2 C)  TempSrc: Oral Oral Oral Oral  SpO2: 100% 98% 98% 98%  Weight:      Height:        Intake/Output Summary (Last 24 hours) at 09/25/2017 1005 Last data filed at 09/25/2017 0513 Gross per 24 hour  Intake 720 ml  Output 200 ml  Net 520 ml   Filed Weights   09/18/17 1813 09/18/17 2250  Weight: 55.3 kg (122 lb) 53.4 kg (  117 lb 11.6 oz)    Exam:  Much more awake alert in nad Eating drinking Some phlegm  No rale sno ronchi abd soft nt nd no rebound no gaur Finger nose wnl pwer 5/5 Reflexes deferred   Data Reviewed: CBC: Recent Labs  Lab 09/20/17 1824 09/21/17 0917 09/22/17 0546 09/23/17 0727 09/24/17 0557  WBC 13.7* 14.1* 13.0* 12.7* 8.8  NEUTROABS 11.5* 10.8* 10.2* 9.6* 8.2*  HGB 11.7* 10.1*  8.3* 7.5* 8.2*  HCT 36.2 30.2* 25.1* 23.2* 25.7*  MCV 96.8 95.0 96.9 99.1 98.5  PLT 310 307 267 237 287   Basic Metabolic Panel: Recent Labs  Lab 09/21/17 0917 09/22/17 0546 09/23/17 0727 09/24/17 0557 09/25/17 0339  NA 135 142 140 137 137  K 3.7 3.4* 4.0 3.7 3.6  CL 96* 107 107 105 105  CO2 28 27 25 25 25   GLUCOSE 170* 96 81 157* 124*  BUN 55* 52* 27* 21* 24*  CREATININE 1.03* 1.05* 0.93 0.94 0.93  CALCIUM 10.9* 9.3 8.7* 8.7* 8.9   GFR: Estimated Creatinine Clearance: 28.8 mL/min (by C-G formula based on SCr of 0.93 mg/dL). Liver Function Tests: Recent Labs  Lab 09/18/17 1921  AST 23  ALT 17  ALKPHOS 145*  BILITOT 0.8  PROT 6.7  ALBUMIN 3.7   No results for input(s): LIPASE, AMYLASE in the last 168 hours. No results for input(s): AMMONIA in the last 168 hours. Coagulation Profile: Recent Labs  Lab 09/18/17 1921  INR 1.08   Cardiac Enzymes: No results for input(s): CKTOTAL, CKMB, CKMBINDEX, TROPONINI in the last 168 hours. BNP (last 3 results) No results for input(s): PROBNP in the last 8760 hours. HbA1C: No results for input(s): HGBA1C in the last 72 hours. CBG: No results for input(s): GLUCAP in the last 168 hours. Lipid Profile: No results for input(s): CHOL, HDL, LDLCALC, TRIG, CHOLHDL, LDLDIRECT in the last 72 hours. Thyroid Function Tests: No results for input(s): TSH, T4TOTAL, FREET4, T3FREE, THYROIDAB in the last 72 hours. Anemia Panel: Recent Labs    09/25/17 0339  FERRITIN 211  TIBC 211*  IRON 18*   Urine analysis:    Component Value Date/Time   COLORURINE YELLOW 09/23/2017 2008   APPEARANCEUR HAZY (A) 09/23/2017 2008   LABSPEC 1.010 09/23/2017 2008   PHURINE 6.0 09/23/2017 2008   GLUCOSEU NEGATIVE 09/23/2017 2008   HGBUR SMALL (A) 09/23/2017 2008   BILIRUBINUR NEGATIVE 09/23/2017 2008   KETONESUR NEGATIVE 09/23/2017 2008   PROTEINUR NEGATIVE 09/23/2017 2008   UROBILINOGEN 0.2 08/30/2010 1430   NITRITE NEGATIVE 09/23/2017 2008    LEUKOCYTESUR SMALL (A) 09/23/2017 2008   Sepsis Labs: @LABRCNTIP (procalcitonin:4,lacticidven:4)  )No results found for this or any previous visit (from the past 240 hour(s)).    Studies: No results found.  Scheduled Meds: . ALPRAZolam  0.5 mg Oral QHS  . buPROPion  150 mg Oral BID  . carvedilol  12.5 mg Oral BID WC  . ferrous sulfate  325 mg Oral BID WC  . FLUoxetine  40 mg Oral Daily  . levothyroxine  50 mcg Oral QAC breakfast  . multivitamin with minerals  1 tablet Oral Daily  . pantoprazole  40 mg Oral BID  . potassium chloride SA  40 mEq Oral q morning - 10a  . pravastatin  20 mg Oral q1800  . timolol  1 drop Both Eyes BID    Continuous Infusions: . sodium chloride       LOS: 4 days     Nita Sells, MD [facility]  If 7PM-7AM, please contact night-coverage [email or web address] Password [email or web address] 09/25/2017, 10:05 AM

## 2017-09-25 NOTE — Progress Notes (Signed)
CSW checked into bed availability at [facility], as the patient's daughter requested that facility as first choice. [facility] said they would have to see about beds on the day that the patient is ready to discharge.   CSW later received a voicemail from patient's daughter that she would prefer [facility] instead. CSW contacted [facility]; left a message.   CSW will continue to follow.  Laveda Abbe, [place] Clinical Social Worker [phone]

## 2017-09-25 NOTE — Clinical Social Work Note (Signed)
Clinical Social Work Assessment  Patient Details  Name: [name] MRN: [id number] Date of Birth: 1923-03-29  Date of referral:  09/24/17               Reason for consult:  Facility Placement                Permission sought to share information with:  Facility Sport and exercise psychologist, Family Supports Permission granted to share information::  Yes, Verbal Permission Granted  Name::     Chiropractor::  SNF  Relationship::  Daughter  Contact Information:     Housing/Transportation Living arrangements for the past 2 months:  Single Family Home Source of Information:  Adult Children Patient Interpreter Needed:  None Criminal Activity/Legal Involvement Pertinent to Current Situation/Hospitalization:  No - Comment as needed Significant Relationships:  Adult Children, Community Support Lives with:  Self Do you feel safe going back to the place where you live?  Yes Need for family participation in patient care:  Yes (Comment)(patient requested her daughter be contacted)  Care giving concerns:  Patient has been living at home, with aide support part time in the morning and help with the patient's daughter in the evenings and weekends. Patient will need more support at discharge because of the risk of falls until she gets stronger, so will benefit from short term rehab at discharge before going home.   Social Worker assessment / plan:  CSW met with patient, who deferred to patient's daughter. CSW spoke with patient's daughter on the phone to discuss recommendation for SNF. CSW discussed referral process and bed offers with patient's daughter. CSW to continue to follow.  Employment status:  Retired Nurse, adult PT Recommendations:  [place] / Referral to community resources:  [place]  Patient/Family's Response to care:  Patient's daughter agreeable to SNF placement.  Patient/Family's Understanding of and Emotional  Response to Diagnosis, Current Treatment, and Prognosis:  Patient requested that CSW contact patient's daughter to discuss placement options. Patient's daughter discussed that the patient doesn't have 24 hour care at home, so she will need SNF placement until she is stronger to be able to be on her own for part of the day. Patient's daughter discussed that she would really like the patient to be close to home, so would prefer either [facility] or [facility].   Emotional Assessment Appearance:  Appears stated age Attitude/Demeanor/Rapport:  Engaged Affect (typically observed):  Calm, Appropriate, Pleasant Orientation:  Oriented to Self, Oriented to Place, Oriented to  Time, Oriented to Situation Alcohol / Substance use:  Not Applicable Psych involvement (Current and /or in the community):  No (Comment)  Discharge Needs  Concerns to be addressed:  Care Coordination Readmission within the last 30 days:  No Current discharge risk:  Lives alone, Physical Impairment, Dependent with Mobility Barriers to Discharge:  Continued Medical Work up, Ship broker, Programmer, applications ([place])   Geralynn Ochs, LCSW 09/25/2017, 12:08 PM

## 2017-09-25 NOTE — Progress Notes (Signed)
[facility] PROGRESS NOTE Subjective Patient without any signs of GI bleeding.  Iron level very low  Objective: Vital signs in last 24 hours: Temp:  [98 F (36.7 C)-98.7 F (37.1 C)] 98.4 F (36.9 C) (01/04 0500) Pulse Rate:  [60-77] 64 (01/04 0500) Resp:  [18-19] 18 (01/04 0500) BP: (127-152)/(43-64) 127/59 (01/04 0500) SpO2:  [98 %-100 %] 98 % (01/04 0500) Last BM Date: 09/23/17  Intake/Output from previous day: 01/03 0701 - 01/04 0700 In: 1080 [P.O.:1080] Out: 200 [Urine:200] Intake/Output this shift: No intake/output data recorded.  PE: General--no acute distress  Abdomen--soft nontender  Lab Results: Recent Labs    09/23/17 0727 09/24/17 0557  WBC 12.7* 8.8  HGB 7.5* 8.2*  HCT 23.2* 25.7*  PLT 237 247   BMET Recent Labs    09/23/17 0727 09/24/17 0557 09/25/17 0339  NA 140 137 137  K 4.0 3.7 3.6  CL 107 105 105  CO2 25 25 25   CREATININE 0.93 0.94 0.93   LFT No results for input(s): PROT, AST, ALT, ALKPHOS, BILITOT, BILIDIR, IBILI in the last 72 hours. PT/INR No results for input(s): LABPROT, INR in the last 72 hours. PANCREAS No results for input(s): LIPASE in the last 72 hours.       Studies/Results: Dg Chest Port 1 View  Result Date: 09/23/2017 CLINICAL DATA:  Shortness of breath EXAM: PORTABLE CHEST 1 VIEW COMPARISON:  Chest x-ray dated 09/18/2017. FINDINGS: Increased perihilar and bibasilar opacities, likely representing an combination of pulmonary edema and bilateral pleural effusions. Large hiatal hernia, as demonstrated on earlier abdomen CT of 03/05/2016. Heart size and mediastinal contours are stable. Atherosclerotic changes noted at the aortic arch. Chronic deformity/dislocation of the right humeral head. No acute or suspicious osseous finding. IMPRESSION: 1. New perihilar and bibasilar opacities, most suggestive of a combination of pulmonary edema and bilateral pleural effusions. 2. Stable cardiomegaly. 3. Large hiatal hernia.  4. Aortic atherosclerosis. Electronically Signed   By: Franki Cabot M.D.   On: 09/23/2017 18:45    Medications: I have reviewed the patient's current medications.  Assessment:   1.  Anemia.  EGD was basically negative.  She had a reaction to blood transfusion iron saturation quite low 2.  History of CVA with marked occlusion of the carotid arteries   Plan: I am concerned about trying to do colonoscopy in this elderly woman with severe cerebrovascular disease.  She cannot take a blood transfusion I would go ahead and empirically give her IV iron.  Consider barium enema just to rule out a gross lesion:.  This would not clearly show AVM of the colon at least we would know that she did not have a cold.   Nancy Fetter 09/25/2017, 8:43 AM  This note was created using voice recognition software. Minor errors may Have occurred unintentionally.  Pager: [phone] If no answer or after hours call [phone]

## 2017-09-25 NOTE — Care Management Note (Signed)
Case Management Note  Patient Details  Name: [name] MRN: [id number] Date of Birth: 05-Jul-1923  Subjective/Objective:                    Action/Plan: Current plan is for SNF at d/c. CM following for d/c disposition.   Expected Discharge Date:  09/20/17               Expected Discharge Plan:  Skilled Nursing Facility  In-House Referral:  Clinical Social Work  Discharge planning Services  CM Consult  Post Acute Care Choice:  Home Health Choice offered to:  Adult Children  DME Arranged:    DME Agency:     HH Arranged:    HH Agency:     Status of Service:  In process, will continue to follow  If discussed at Long Length of Stay Meetings, dates discussed:    Additional Comments:  Pollie Friar, RN 09/25/2017, 3:09 PM

## 2017-09-26 ENCOUNTER — Inpatient Hospital Stay (HOSPITAL_COMMUNITY): Payer: Medicare Other

## 2017-09-26 LAB — BASIC METABOLIC PANEL
ANION GAP: 8 (ref 5–15)
BUN: 17 mg/dL (ref 6–20)
CHLORIDE: 103 mmol/L (ref 101–111)
CO2: 26 mmol/L (ref 22–32)
Calcium: 8.7 mg/dL — ABNORMAL LOW (ref 8.9–10.3)
Creatinine, Ser: 0.85 mg/dL (ref 0.44–1.00)
GFR calc Af Amer: >60 mL/min (ref >60)
GFR calc non Af Amer: 58 mL/min — ABNORMAL LOW (ref >60)
Glucose, Bld: 108 mg/dL — ABNORMAL HIGH (ref 65–99)
Potassium: 3.6 mmol/L (ref 3.5–5.1)
SODIUM: 137 mmol/L (ref 135–145)

## 2017-09-26 MED ORDER — IPRATROPIUM-ALBUTEROL 0.5-2.5 (3) MG/3ML IN SOLN
3.0000 mL | Freq: Four times a day (QID) | RESPIRATORY_TRACT | Status: DC | PRN
  Administered 2017-09-26 – 2017-09-28 (×4): 3 mL via RESPIRATORY_TRACT
  Filled 2017-09-26 (×4): qty 3

## 2017-09-26 NOTE — Progress Notes (Signed)
Pt noted to be coughing when eating/ drinking. SLP eval ordered, MD notified

## 2017-09-26 NOTE — Progress Notes (Signed)
Subjective: No complaints. No GI bleeding.  Objective: Vital signs in last 24 hours: Temp:  [98 F (36.7 C)-98.9 F (37.2 C)] 98.3 F (36.8 C) (01/05 0905) Pulse Rate:  [60-81] 81 (01/05 0905) Resp:  [18-22] 22 (01/05 0905) BP: (129-161)/(45-69) 129/64 (01/05 0905) SpO2:  [94 %-98 %] 94 % (01/05 0905) Weight change:  Last BM Date: 09/23/17  PE: GEN:  NAD ABD:  Soft, non-tender  Lab Results: CBC    Component Value Date/Time   WBC 8.8 09/24/2017 0557   RBC 2.61 (L) 09/24/2017 0557   HGB 8.2 (L) 09/24/2017 0557   HCT 25.7 (L) 09/24/2017 0557   PLT 247 09/24/2017 0557   MCV 98.5 09/24/2017 0557   MCH 31.4 09/24/2017 0557   MCHC 31.9 09/24/2017 0557   RDW 14.4 09/24/2017 0557   LYMPHSABS 0.5 (L) 09/24/2017 0557   MONOABS 0.1 09/24/2017 0557   EOSABS 0.0 09/24/2017 0557   BASOSABS 0.0 09/24/2017 0557   CMP     Component Value Date/Time   NA 137 09/26/2017 0346   K 3.6 09/26/2017 0346   CL 103 09/26/2017 0346   CO2 26 09/26/2017 0346   GLUCOSE 108 (H) 09/26/2017 0346   BUN 17 09/26/2017 0346   CREATININE 0.85 09/26/2017 0346   CALCIUM 8.7 (L) 09/26/2017 0346   PROT 6.7 09/18/2017 1921   ALBUMIN 3.7 09/18/2017 1921   AST 23 09/18/2017 1921   ALT 17 09/18/2017 1921   ALKPHOS 145 (H) 09/18/2017 1921   BILITOT 0.8 09/18/2017 1921   GFRNONAA 58 (L) 09/26/2017 0346   GFRAA >60 09/26/2017 0346   Assessment:  1.  Anemia.  Unrevealing endoscopy. 2.  Recent stroke. 3.  Multiple comorbidities.  Plan:  1.  I would not do any further investigation from GI perspective into patient's anemia.  Regardless of what is found, she is not going to be candidate for any kind of invasive interventions, now (because of her recent stroke) or ever in the future (because of her multiple comorbidities).  Would treat anemia supportively with oral/IV iron and/or blood transfusions as need arises. 2.  [facility] will sign-off; there is no need for outpatient GI follow-up; thanks for the  consultation; please call with any questions.   [name] 09/26/2017, 12:11 PM   Cell [phone] If no answer or after 5 PM call [phone]

## 2017-09-26 NOTE — Progress Notes (Addendum)
Pt off floor for imaging/ xray @ 1405  Pt returned to unit @1425 

## 2017-09-26 NOTE — Evaluation (Signed)
Clinical/Bedside Swallow Evaluation Patient Details  Name: [name] MRN: [id number] Date of Birth: 1923-02-04  Today's Date: 09/26/2017 Time: SLP Start Time (ACUTE ONLY): 76 SLP Stop Time (ACUTE ONLY): 1635 SLP Time Calculation (min) (ACUTE ONLY): 23 min  Past Medical History:  Past Medical History:  Diagnosis Date  . Anemia   . Arthritis   . History of umbilical hernia repair   . Hypertension    Past Surgical History:  Past Surgical History:  Procedure Laterality Date  . APPENDECTOMY    . ESOPHAGOGASTRODUODENOSCOPY N/A 09/23/2017   Procedure: ESOPHAGOGASTRODUODENOSCOPY (EGD);  Surgeon: Laurence Spates, MD;  Location: [facility] ENDOSCOPY;  Service: Endoscopy;  Laterality: N/A;  . HERNIA REPAIR    . TOTAL KNEE ARTHROPLASTY     right  . TOTAL KNEE ARTHROPLASTY     left  . UMBILICAL HERNIA REPAIR     HPI:  [name] is a 84 y.o. female with history of hypertension, possibly CHF, hypothyroidism and meningioma was brought to the ER after patient was found to have left-sided weakness. CT negative for acute changes, showed meningioma. 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 Seen by SLP for cognitive evaluation only as she passed stroke swallow screen. Pt had coffee ground emesis 12/30. 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 Large hiatal hernia seen on CXR 09/23/17. Referred for swallowing evaluation after RN noted coughing with meal.   Assessment / Plan / Recommendation Clinical Impression  Patient presents with signs of pharyngeal dysphagia, with worsening congested/productive coughing after intake of liquids and solids. Although pt is coughing at baseline, immediate cough, wet vocal quality and increased work of breathing concerning for aspiration. Recommend NPO with exception of medications crushed in puree, ice  chips/sips of water after oral care pending further assessment of swallow function. MBS to be performed next date.    SLP Visit Diagnosis: Dysphagia, oropharyngeal phase (R13.12)    Aspiration Risk  Moderate aspiration risk    Diet Recommendation NPO except meds;Ice chips PRN after oral care;Free water protocol after oral care   Medication Administration: Crushed with puree Supervision: Full supervision/cueing for compensatory strategies    Other  Recommendations Oral Care Recommendations: Oral care QID;Oral care prior to ice chip/H20   Follow up Recommendations Skilled Nursing facility      Frequency and Duration            Prognosis        Swallow Study   General Date of Onset: 09/18/17 HPI: [name] is a 84 y.o. female with history of hypertension, possibly CHF, hypothyroidism and meningioma was brought to the ER after patient was found to have left-sided weakness. CT negative for acute changes, showed meningioma. 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 Seen by SLP for cognitive evaluation only as she passed stroke swallow screen. Pt had coffee ground emesis 12/30. 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 Large hiatal hernia seen on CXR 09/23/17. Referred for swallowing evaluation after RN noted coughing with meal. Type of Study: Bedside Swallow Evaluation Previous Swallow Assessment: passed stroke swallow screen 09/18/17 Diet Prior to this Study: Dysphagia 3 (soft);Thin liquids Temperature Spikes Noted: No Respiratory Status: Room air History of Recent Intubation: No Behavior/Cognition: Alert;Cooperative;Confused Oral Cavity Assessment: Within Functional Limits Oral Care Completed  by SLP: No Vision: Functional for self-feeding Self-Feeding Abilities: Able to feed self Patient Positioning: Upright in  bed;Other (comment)(Pt required frequent repositioning) Baseline Vocal Quality: Normal Volitional Cough: Congested Volitional Swallow: Able to elicit    Oral/Motor/Sensory Function Overall Oral Motor/Sensory Function: Within functional limits   Ice Chips Ice chips: Within functional limits   Thin Liquid Thin Liquid: Impaired Presentation: Cup;Straw Pharyngeal  Phase Impairments: Cough - Immediate;Cough - Delayed;Wet Vocal Quality    Nectar Thick Nectar Thick Liquid: Impaired Presentation: Cup;Straw;Self Fed Pharyngeal Phase Impairments: Wet Vocal Quality;Cough - Immediate;Cough - Delayed   Honey Thick Honey Thick Liquid: Not tested   Puree Puree: Impaired Presentation: Self Fed;Spoon Pharyngeal Phase Impairments: Cough - Delayed   Solid   GO   Solid: Impaired Pharyngeal Phase Impairments: Wet Vocal Quality;Cough - Immediate;Cough - Delayed       Deneise Lever, MS, CCC-SLP Speech-Language Pathologist [place] 09/26/2017,4:56 PM

## 2017-09-26 NOTE — Progress Notes (Signed)
PROGRESS NOTE  [name] IRW:[id number] DOB: April 22, 1923 DOA: 09/18/2017 PCP: Lawerance Cruel, MD  HPI/Recap of past 24 hours:  89 fem htn, chf, hypothyroid, meningioma-came to ED 2/2 L side weak, L facial droop Admit 12/29 with Possible CVA.  Patient symptoms lasted for less than 3 minutes following which patient came back to baseline. In the ER patient was appearing nonfocal. CT head was showing possible meningioma but otherwise nothing acute.  EKG was showing sinus bradycardia.  Neurologist on-call was consulted and patient admitted for further management.   SUJ  Coughing with phlegm in nad No fever no chills  Assessment/Plan: Active Problems:   Intracranial vascular stenosis   Essential hypertension   Cerebrovascular accident (CVA) due to occlusion of right middle cerebral artery (HCC)   Carotid occlusion, left   Stenosis of right carotid artery   Hyperlipidemia   Meningioma (HCC)   CVA (cerebral vascular accident) ([place])  #Right MCA acute infarct Pt currently stable MRI = right MCA moderate-sized infarct CTA head neck showed chr left ICA chronic occlusion, right M2 occlusion corresponding to right MCA infarct CT head: Stable 2.4 cm partially calcified right parasellar mass consistent with history of meningioma  ECHO: LVEF 65-70%, moderate LVH, normal wall motion, mild calcific aortic valve stenosis, mild MR, severe LAE, trivial TR, RVSP 38mmHg, normal IVC A1c 5.1, LDL 75 Neurology rec's: Cardiac event monitor as outpt to r/o Afib, Continue DAPT for 3 months and then plavix alone (ASA 325mg  + Plavix 75mg ) Antiplatelet therapy held due to ??GIB-d/w Dr Oletta Lamas and okays re-start ASA at full dose in maybe 10 days or so---enema contemplatede-will hold-feel patient to frail to tolerate prep Will need SNF and can d/c in 1-2 days when available   #Acute drop in hgb??Upper GIB Hb is~ 8 and is stable Resolved coffee ground emesis, elevated BUN trending down, ++drop in  hgb GI on board, EGD no obvious signs of bleeding/stigmata of recent bleed. Ordered FOBT. May consider bleeding scan if Hgb keeps dropping -could not transfuse as patient became acutely SOB Held DAPT, lovenox due to possible GIB Continue IV protonix--hemoglobin stable in 8 range currently with no evidence of bleed Iron 18, Tsat 9--giving ferreheme  #Hypertension Stable Restarted home coreg, but decreased dose to 12.5 mg BID Held PTA lasix, lisinopril-controlled and would d/c moving forward  #Hypothyroidism TSH 3.515 Continue Synthroid, TSH in 4 weeks  #History of iron deficiency anemia Continue iron supplements  #Depression Continue wellbutrin 150 bid, xanax 0.5 pm and prozac 40 qd  Cough etiologyn unclear-will get CXr today to eval, hold Abx at this time   Code Status: Full  Family Communication: None at bedside  Disposition Plan: need sNF d/c   Consultants:  Neurology  GI  Procedures:  EGD on 09/23/17  Antimicrobials:  None  DVT prophylaxis:  SCDs   Objective: Vitals:   09/25/17 2151 09/26/17 0110 09/26/17 0518 09/26/17 0905  BP: (!) 143/48 (!) 130/45 (!) 161/69 129/64  Pulse: 63 64 68 81  Resp: 20 18 18  (!) 22  Temp: 98.2 F (36.8 C) 98.2 F (36.8 C) 98.5 F (36.9 C) 98.3 F (36.8 C)  TempSrc: Oral Oral Oral Oral  SpO2: 96% 98% 94% 94%  Weight:      Height:       No intake or output data in the 24 hours ending 09/26/17 1409 Filed Weights   09/18/17 1813 09/18/17 2250  Weight: 55.3 kg (122 lb) 53.4 kg (117 lb 11.6 oz)  Exam:  Awake alert tol some diet but coughing Chest clear no added sound abd soft No focal deficit moving 4 limbs equally Power 5/5 s1 s 2? hsm   Data Reviewed: CBC: Recent Labs  Lab 09/20/17 1824 09/21/17 0917 09/22/17 0546 09/23/17 0727 09/24/17 0557  WBC 13.7* 14.1* 13.0* 12.7* 8.8  NEUTROABS 11.5* 10.8* 10.2* 9.6* 8.2*  HGB 11.7* 10.1* 8.3* 7.5* 8.2*  HCT 36.2 30.2* 25.1* 23.2* 25.7*  MCV 96.8 95.0  96.9 99.1 98.5  PLT 310 307 267 237 329   Basic Metabolic Panel: Recent Labs  Lab 09/22/17 0546 09/23/17 0727 09/24/17 0557 09/25/17 0339 09/26/17 0346  NA 142 140 137 137 137  K 3.4* 4.0 3.7 3.6 3.6  CL 107 107 105 105 103  CO2 27 25 25 25 26   GLUCOSE 96 81 157* 124* 108*  BUN 52* 27* 21* 24* 17  CREATININE 1.05* 0.93 0.94 0.93 0.85  CALCIUM 9.3 8.7* 8.7* 8.9 8.7*   GFR: Estimated Creatinine Clearance: 31.5 mL/min (by C-G formula based on SCr of 0.85 mg/dL). Liver Function Tests: No results for input(s): AST, ALT, ALKPHOS, BILITOT, PROT, ALBUMIN in the last 168 hours. No results for input(s): LIPASE, AMYLASE in the last 168 hours. No results for input(s): AMMONIA in the last 168 hours. Coagulation Profile: No results for input(s): INR, PROTIME in the last 168 hours. Cardiac Enzymes: No results for input(s): CKTOTAL, CKMB, CKMBINDEX, TROPONINI in the last 168 hours. BNP (last 3 results) No results for input(s): PROBNP in the last 8760 hours. HbA1C: No results for input(s): HGBA1C in the last 72 hours. CBG: No results for input(s): GLUCAP in the last 168 hours. Lipid Profile: No results for input(s): CHOL, HDL, LDLCALC, TRIG, CHOLHDL, LDLDIRECT in the last 72 hours. Thyroid Function Tests: No results for input(s): TSH, T4TOTAL, FREET4, T3FREE, THYROIDAB in the last 72 hours. Anemia Panel: Recent Labs    09/25/17 0339  FERRITIN 211  TIBC 211*  IRON 18*   Urine analysis:    Component Value Date/Time   COLORURINE YELLOW 09/23/2017 2008   APPEARANCEUR HAZY (A) 09/23/2017 2008   LABSPEC 1.010 09/23/2017 2008   PHURINE 6.0 09/23/2017 2008   GLUCOSEU NEGATIVE 09/23/2017 2008   HGBUR SMALL (A) 09/23/2017 2008   BILIRUBINUR NEGATIVE 09/23/2017 2008   KETONESUR NEGATIVE 09/23/2017 2008   PROTEINUR NEGATIVE 09/23/2017 2008   UROBILINOGEN 0.2 08/30/2010 1430   NITRITE NEGATIVE 09/23/2017 2008   LEUKOCYTESUR SMALL (A) 09/23/2017 2008   Sepsis  Labs: @LABRCNTIP (procalcitonin:4,lacticidven:4)  )No results found for this or any previous visit (from the past 240 hour(s)).    Studies: No results found.  Scheduled Meds: . ALPRAZolam  0.5 mg Oral QHS  . buPROPion  150 mg Oral BID  . carvedilol  12.5 mg Oral BID WC  . FLUoxetine  40 mg Oral Daily  . levothyroxine  50 mcg Oral QAC breakfast  . multivitamin with minerals  1 tablet Oral Daily  . pantoprazole  40 mg Oral BID  . potassium chloride SA  40 mEq Oral q morning - 10a  . pravastatin  20 mg Oral q1800  . timolol  1 drop Both Eyes BID    Continuous Infusions: . sodium chloride       LOS: 5 days     Nita Sells, MD [facility]   If 7PM-7AM, please contact night-coverage [email or web address] Password [email or web address] 09/26/2017, 2:09 PM

## 2017-09-27 ENCOUNTER — Inpatient Hospital Stay (HOSPITAL_COMMUNITY): Payer: Medicare Other

## 2017-09-27 LAB — BASIC METABOLIC PANEL WITH GFR
Anion gap: 7 (ref 5–15)
BUN: 12 mg/dL (ref 6–20)
CO2: 23 mmol/L (ref 22–32)
Calcium: 8.6 mg/dL — ABNORMAL LOW (ref 8.9–10.3)
Chloride: 108 mmol/L (ref 101–111)
Creatinine, Ser: 0.86 mg/dL (ref 0.44–1.00)
GFR calc Af Amer: >60 mL/min
GFR calc non Af Amer: 57 mL/min — ABNORMAL LOW
Glucose, Bld: 100 mg/dL — ABNORMAL HIGH (ref 65–99)
Potassium: 4.2 mmol/L (ref 3.5–5.1)
Sodium: 138 mmol/L (ref 135–145)

## 2017-09-27 LAB — CBC WITH DIFFERENTIAL/PLATELET
Basophils Absolute: 0 K/uL (ref 0.0–0.1)
Basophils Relative: 0 %
Eosinophils Absolute: 0.2 K/uL (ref 0.0–0.7)
Eosinophils Relative: 2 %
HCT: 27.1 % — ABNORMAL LOW (ref 36.0–46.0)
Hemoglobin: 8.4 g/dL — ABNORMAL LOW (ref 12.0–15.0)
Lymphocytes Relative: 9 %
Lymphs Abs: 1 K/uL (ref 0.7–4.0)
MCH: 30.4 pg (ref 26.0–34.0)
MCHC: 31 g/dL (ref 30.0–36.0)
MCV: 98.2 fL (ref 78.0–100.0)
Monocytes Absolute: 1.5 K/uL — ABNORMAL HIGH (ref 0.1–1.0)
Monocytes Relative: 13 %
Neutro Abs: 8.3 K/uL — ABNORMAL HIGH (ref 1.7–7.7)
Neutrophils Relative %: 76 %
Platelets: 361 K/uL (ref 150–400)
RBC: 2.76 MIL/uL — ABNORMAL LOW (ref 3.87–5.11)
RDW: 15 % (ref 11.5–15.5)
WBC: 11 K/uL — ABNORMAL HIGH (ref 4.0–10.5)

## 2017-09-27 MED ORDER — POTASSIUM CHLORIDE 20 MEQ PO PACK
40.0000 meq | PACK | Freq: Every morning | ORAL | Status: DC
  Administered 2017-09-27: 40 meq via ORAL
  Filled 2017-09-27 (×2): qty 2

## 2017-09-27 MED ORDER — PANTOPRAZOLE SODIUM 40 MG PO PACK: 40.0000 mg | PACK | Freq: Every day | ORAL | Status: DC

## 2017-09-27 MED ORDER — RESOURCE THICKENUP CLEAR PO POWD
ORAL | Status: DC | PRN
  Filled 2017-09-27: qty 125

## 2017-09-27 MED ORDER — POTASSIUM CHLORIDE 20 MEQ PO PACK
40.0000 meq | PACK | Freq: Every morning | ORAL | Status: DC
  Filled 2017-09-27: qty 2

## 2017-09-27 MED ORDER — PANTOPRAZOLE SODIUM 40 MG PO PACK
40.0000 mg | PACK | Freq: Every day | ORAL | Status: DC
  Administered 2017-09-27: 40 mg via ORAL
  Filled 2017-09-27: qty 20

## 2017-09-27 MED ORDER — BUPROPION HCL 75 MG PO TABS
150.0000 mg | ORAL_TABLET | Freq: Two times a day (BID) | ORAL | Status: DC
  Administered 2017-09-27: 150 mg via ORAL
  Filled 2017-09-27 (×2): qty 2

## 2017-09-27 NOTE — Progress Notes (Signed)
PROGRESS NOTE  [name] FUX:[id number] DOB: 1922-10-01 DOA: 09/18/2017 PCP: Lawerance Cruel, MD  HPI/Recap of past 24 hours:  89 fem htn, chf, hypothyroid, meningioma-came to ED 2/2 L side weak, L facial droop Admit 12/29 with Possible CVA.  Patient symptoms lasted for less than 3 minutes following which patient came back to baseline. In the ER patient was appearing nonfocal. CT head was showing possible meningioma but otherwise nothing acute.  EKG was showing sinus bradycardia.  Neurologist on-call was consulted and patient admitted for further management.   SUJ  Some cough but improved Swallow eval done No cp no fever no chills Doesn't feel as good today  Assessment/Plan: Active Problems:   Intracranial vascular stenosis   Essential hypertension   Cerebrovascular accident (CVA) due to occlusion of right middle cerebral artery (HCC)   Carotid occlusion, left   Stenosis of right carotid artery   Hyperlipidemia   Meningioma (HCC)   CVA (cerebral vascular accident) ([place])  #Right MCA acute infarct Pt currently stable MRI = right MCA moderate-sized infarct CTA head neck showed chr left ICA chronic occlusion, right M2 occlusion corresponding to right MCA infarct CT head: Stable 2.4 cm partially calcified right parasellar mass consistent with history of meningioma  ECHO: LVEF 65-70%, moderate LVH, normal wall motion, mild calcific aortic valve stenosis, mild MR, severe LAE, trivial TR, RVSP 44mmHg, normal IVC A1c 5.1, LDL 75 Neurology rec's: Cardiac event monitor as outpt to r/o Afib, Continue DAPT for 3 months and then plavix alone (ASA 325mg  + Plavix 75mg ) Antiplatelet therapy held due to ??GIB-d/w Dr Oletta Lamas and Scherrie Bateman re-start ASA at full dose in maybe 10 days or so---enema contemplatede-will hold-feel patient to frail to tolerate prep  Dysphagia Likely from MCA infarct CXR done 1/7 doesn't reveal infiltrates SLP has seen and rec Dys II diet which has been  ordered-monitor and reasses progress in am Wbc slightly up but no fever nor chills Rpt in am  #Acute drop in hgb??Upper GIB Hb is~ 8 and is stable Resolved coffee ground emesis, elevated BUN trending down, ++drop in hgb GI on board, EGD no obvious signs of bleeding/stigmata of recent bleed. Ordered FOBT. -could not transfuse as patient became acutely SOB Held DAPT, lovenox due to possible GIB Changing IV to Po protonix--hemoglobin stable in 8 range currently with no evidence of bleed Iron 18, Tsat 9--given ferreheme  #Hypertension Stable Restarted home coreg, but decreased dose to 12.5 mg BID Held PTA lasix, lisinopril-controlled and would d/c moving forward  #Hypothyroidism TSH 3.515 Continue Synthroid, TSH in 4 weeks  #History of iron deficiency anemia Continue iron supplements  #Depression Continue wellbutrin 150 bid, xanax 0.5 pm and prozac 40 qd   Code Status: Full  Family Communication: None at bedside  Disposition Plan: need sNF d/c   Consultants:  Neurology  GI  Procedures:  EGD on 09/23/17  Antimicrobials:  None  DVT prophylaxis:  SCDs   Objective: Vitals:   09/27/17 0859 09/27/17 0904 09/27/17 0917 09/27/17 0949  BP: (!) 195/76  (!) 143/77 (!) 152/66  Pulse:  75  65  Resp:    18  Temp:    98.3 F (36.8 C)  TempSrc:    Oral  SpO2:    94%  Weight:      Height:       No intake or output data in the 24 hours ending 09/27/17 1310 Filed Weights   09/18/17 1813 09/18/17 2250  Weight: 55.3 kg (122 lb) 53.4 kg (  117 lb 11.6 oz)    Exam:  Awake pleasant  Some mild cough-no rales no ronchi no added sound abd soft but slightly tender above umbilicus-small mass felt No LE edema Moving 4 limbs without deficit, reflexes benign   Data Reviewed: CBC: Recent Labs  Lab 09/21/17 0917 09/22/17 0546 09/23/17 0727 09/24/17 0557 09/27/17 0312  WBC 14.1* 13.0* 12.7* 8.8 11.0*  NEUTROABS 10.8* 10.2* 9.6* 8.2* 8.3*  HGB 10.1* 8.3* 7.5* 8.2* 8.4*   HCT 30.2* 25.1* 23.2* 25.7* 27.1*  MCV 95.0 96.9 99.1 98.5 98.2  PLT 307 267 237 247 517   Basic Metabolic Panel: Recent Labs  Lab 09/23/17 0727 09/24/17 0557 09/25/17 0339 09/26/17 0346 09/27/17 0312  NA 140 137 137 137 138  K 4.0 3.7 3.6 3.6 4.2  CL 107 105 105 103 108  CO2 25 25 25 26 23   GLUCOSE 81 157* 124* 108* 100*  BUN 27* 21* 24* 17 12  CREATININE 0.93 0.94 0.93 0.85 0.86  CALCIUM 8.7* 8.7* 8.9 8.7* 8.6*   GFR: Estimated Creatinine Clearance: 31.2 mL/min (by C-G formula based on SCr of 0.86 mg/dL). Liver Function Tests: No results for input(s): AST, ALT, ALKPHOS, BILITOT, PROT, ALBUMIN in the last 168 hours. No results for input(s): LIPASE, AMYLASE in the last 168 hours. No results for input(s): AMMONIA in the last 168 hours. Coagulation Profile: No results for input(s): INR, PROTIME in the last 168 hours. Cardiac Enzymes: No results for input(s): CKTOTAL, CKMB, CKMBINDEX, TROPONINI in the last 168 hours. BNP (last 3 results) No results for input(s): PROBNP in the last 8760 hours. HbA1C: No results for input(s): HGBA1C in the last 72 hours. CBG: No results for input(s): GLUCAP in the last 168 hours. Lipid Profile: No results for input(s): CHOL, HDL, LDLCALC, TRIG, CHOLHDL, LDLDIRECT in the last 72 hours. Thyroid Function Tests: No results for input(s): TSH, T4TOTAL, FREET4, T3FREE, THYROIDAB in the last 72 hours. Anemia Panel: Recent Labs    09/25/17 0339  FERRITIN 211  TIBC 211*  IRON 18*   Urine analysis:    Component Value Date/Time   COLORURINE YELLOW 09/23/2017 2008   APPEARANCEUR HAZY (A) 09/23/2017 2008   LABSPEC 1.010 09/23/2017 2008   PHURINE 6.0 09/23/2017 2008   GLUCOSEU NEGATIVE 09/23/2017 2008   HGBUR SMALL (A) 09/23/2017 2008   BILIRUBINUR NEGATIVE 09/23/2017 2008   KETONESUR NEGATIVE 09/23/2017 2008   PROTEINUR NEGATIVE 09/23/2017 2008   UROBILINOGEN 0.2 08/30/2010 1430   NITRITE NEGATIVE 09/23/2017 2008   LEUKOCYTESUR SMALL (A)  09/23/2017 2008   Sepsis Labs: @LABRCNTIP (procalcitonin:4,lacticidven:4)  )No results found for this or any previous visit (from the past 240 hour(s)).    Studies: Dg Chest 2 View  Result Date: 09/26/2017 CLINICAL DATA:  Coughing congestion question pneumonia, history hypertension EXAM: CHEST  2 VIEW COMPARISON:  09/23/2017 FINDINGS: Enlargement of cardiac silhouette with mitral annular calcification. Atherosclerotic calcifications aorta. Pulmonary vascular congestion. Large hiatal hernia seen on the previous exam is inapparent on current study. BILATERAL pleural effusions LEFT greater than RIGHT increased on LEFT since prior exam. Increased atelectasis versus consolidation at LEFT lung base. Question minimal pulmonary edema, slightly improved on RIGHT. No pneumothorax. Bones demineralized with osteolysis of the RIGHT humeral head. IMPRESSION: Increased LEFT pleural effusion and basilar atelectasis versus consolidation. Enlargement of cardiac silhouette. Question mildly improved pulmonary edema with persistent small RIGHT pleural effusion. Electronically Signed   By: Lavonia Dana M.D.   On: 09/26/2017 16:08   Dg Swallowing Func-speech Pathology  Result Date:  09/27/2017 Objective Swallowing Evaluation: Type of Study: MBS-Modified Barium Swallow Study  Patient Details Name: [name] MRN: [id number] Date of Birth: 10-15-1922 Today's Date: 09/27/2017 Time: SLP Start Time (ACUTE ONLY): 1055 -SLP Stop Time (ACUTE ONLY): 1120 SLP Time Calculation (min) (ACUTE ONLY): 25 min Past Medical History: Past Medical History: Diagnosis Date . Anemia  . Arthritis  . History of umbilical hernia repair  . Hypertension  Past Surgical History: Past Surgical History: Procedure Laterality Date . APPENDECTOMY   . ESOPHAGOGASTRODUODENOSCOPY N/A 09/23/2017  Procedure: ESOPHAGOGASTRODUODENOSCOPY (EGD);  Surgeon: Laurence Spates, MD;  Location[facility] ENDOSCOPY;  Service: Endoscopy;  Laterality: N/A; . HERNIA REPAIR   . TOTAL KNEE  ARTHROPLASTY    right . TOTAL KNEE ARTHROPLASTY    left . UMBILICAL HERNIA REPAIR   HPI: DESTIN KITTLER is a 84 y.o. female with history of hypertension, possibly CHF, hypothyroidism and meningioma was brought to the ER after patient was found to have left-sided weakness. CT negative for acute changes, showed meningioma. 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 Seen by SLP for cognitive evaluation only as she passed stroke swallow screen. Pt had coffee ground emesis 12/30. 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 Large hiatal hernia seen on CXR 09/23/17. Referred for swallowing evaluation after RN noted coughing with meal.  Subjective: Pt in chair Assessment / Plan / Recommendation CHL IP CLINICAL IMPRESSIONS 09/27/2017 Clinical Impression Patient presents with moderate oropharyngeal dysphagia with sensory and motor impairments contributing to aspiration of thin and nectar thick liquids. Oral stage noted for prolonged oral transit, intermittent oral holding. Swallow initiation delayed to the level of the pyriform sinuses with thin, nectar and larger boluses of honey-thick liquids. This resulted in spillage from pyriforms with trace aspiration during the swallow with thin liquids. Reduced tongue base retraction and pharyngeal constriction resulting in residue in the valleculae, pyriform sinuses. Residue in pyriforms pooled with secretions, leading to trace aspiration after the swallow with thin, nectar and penetration with larger cup sips of honey. Teaspoon delivery confined residue to valleculae with smaller, thicker boluses (honey, puree, solids). While pt did sense aspiration and in some cases laryngeal penetration, her cough was either delayed or too weak to fully clear airway. Chin tuck was not effective for airway protection with thinner liquids,  though with a dry swallow chin tuck reduced residue in the valleculae with teaspoons of puree, honey thick liquids. One instance of deep penetration of puree when taken with barium tablet due to oral discoordination with premature spillage. Pt ultimately retained barium tablet along her palate, requiring SLP to retrieve it. Recommend pt consume dys 2 solids with honey thick liquids via teaspoon only, full supervision, meds crushed in puree. She remains at mild-moderate aspiration risk due to residue. Effortful swallow with chin tuck intermittently to clear residue. SLP will f/u for education and training in compensatory strategies. She may benefit from RMT for pharyngeal and cough strength. SLP Visit Diagnosis Dysphagia, oropharyngeal phase (R13.12) Attention and concentration deficit following -- Frontal lobe and executive function deficit following -- Impact on safety and function Mild aspiration risk;Moderate aspiration risk   CHL IP TREATMENT RECOMMENDATION 09/27/2017 Treatment Recommendations F/U MBS in --- days (Comment);Therapy as outlined in treatment plan below   Prognosis 09/27/2017 Prognosis for Safe Diet Advancement Good Barriers to Reach Goals -- Barriers/Prognosis Comment -- CHL IP DIET RECOMMENDATION 09/27/2017 SLP Diet Recommendations Dysphagia 2 (  Fine chop) solids;Honey thick liquids Liquid Administration via Spoon;No straw Medication Administration Crushed with puree Compensations Effortful swallow;Chin tuck;Multiple dry swallows after each bite/sip Postural Changes --   CHL IP OTHER RECOMMENDATIONS 09/27/2017 Recommended Consults -- Oral Care Recommendations Oral care BID Other Recommendations --   CHL IP FOLLOW UP RECOMMENDATIONS 09/27/2017 Follow up Recommendations Skilled Nursing facility   [facility] IP FREQUENCY AND DURATION 09/27/2017 Speech Therapy Frequency (ACUTE ONLY) min 2x/week Treatment Duration 2 weeks      CHL IP ORAL PHASE 09/27/2017 Oral Phase Impaired Oral - Pudding Teaspoon -- Oral - Pudding Cup --  Oral - Honey Teaspoon -- Oral - Honey Cup -- Oral - Nectar Teaspoon -- Oral - Nectar Cup -- Oral - Nectar Straw -- Oral - Thin Teaspoon -- Oral - Thin Cup -- Oral - Thin Straw -- Oral - Puree -- Oral - Mech Soft -- Oral - Regular -- Oral - Multi-Consistency -- Oral - Pill -- Oral Phase - Comment intermittent oral holding, prolonged oral transit, decreased bolus cohesion/premature spillage with mixed consistencies  CHL IP PHARYNGEAL PHASE 09/27/2017 Pharyngeal Phase Impaired Pharyngeal- Pudding Teaspoon -- Pharyngeal -- Pharyngeal- Pudding Cup -- Pharyngeal -- Pharyngeal- Honey Teaspoon Delayed swallow initiation-vallecula;Reduced tongue base retraction;Reduced pharyngeal peristalsis;Pharyngeal residue - valleculae;Compensatory strategies attempted (with notebox) Pharyngeal -- Pharyngeal- Honey Cup Delayed swallow initiation-pyriform sinuses;Reduced pharyngeal peristalsis;Reduced tongue base retraction;Penetration/Apiration after swallow;Pharyngeal residue - valleculae;Pharyngeal residue - pyriform Pharyngeal Material enters airway, remains ABOVE vocal cords and not ejected out Pharyngeal- Nectar Teaspoon Delayed swallow initiation-pyriform sinuses;Reduced pharyngeal peristalsis;Reduced tongue base retraction;Penetration/Apiration after swallow;Pharyngeal residue - valleculae;Pharyngeal residue - pyriform Pharyngeal Material enters airway, remains ABOVE vocal cords and not ejected out Pharyngeal- Nectar Cup Delayed swallow initiation-pyriform sinuses;Reduced pharyngeal peristalsis;Reduced tongue base retraction;Penetration/Aspiration during swallow;Trace aspiration;Pharyngeal residue - valleculae;Pharyngeal residue - pyriform Pharyngeal Material enters airway, passes BELOW cords and not ejected out despite cough attempt by patient Pharyngeal- Nectar Straw Delayed swallow initiation-pyriform sinuses;Reduced tongue base retraction;Penetration/Aspiration during swallow;Trace aspiration;Pharyngeal residue -  pyriform;Pharyngeal residue - valleculae Pharyngeal Material enters airway, passes BELOW cords and not ejected out despite cough attempt by patient Pharyngeal- Thin Teaspoon Delayed swallow initiation-pyriform sinuses;Reduced pharyngeal peristalsis;Reduced tongue base retraction;Penetration/Aspiration during swallow;Trace aspiration;Pharyngeal residue - valleculae;Pharyngeal residue - pyriform Pharyngeal Material enters airway, passes BELOW cords and not ejected out despite cough attempt by patient Pharyngeal- Thin Cup Delayed swallow initiation-pyriform sinuses;Reduced pharyngeal peristalsis;Reduced tongue base retraction;Penetration/Aspiration during swallow;Trace aspiration;Pharyngeal residue - valleculae;Pharyngeal residue - pyriform Pharyngeal Material enters airway, passes BELOW cords and not ejected out despite cough attempt by patient Pharyngeal- Thin Straw -- Pharyngeal -- Pharyngeal- Puree Delayed swallow initiation-vallecula;Reduced pharyngeal peristalsis;Reduced tongue base retraction;Pharyngeal residue - valleculae;Other (Comment) Pharyngeal -- Pharyngeal- Mechanical Soft -- Pharyngeal -- Pharyngeal- Regular Delayed swallow initiation-vallecula;Reduced pharyngeal peristalsis;Reduced tongue base retraction;Pharyngeal residue - valleculae Pharyngeal -- Pharyngeal- Multi-consistency -- Pharyngeal -- Pharyngeal- Pill NT Pharyngeal -- Pharyngeal Comment --  CHL IP CERVICAL ESOPHAGEAL PHASE 09/27/2017 Cervical Esophageal Phase WFL Pudding Teaspoon -- Pudding Cup -- Honey Teaspoon -- Honey Cup -- Nectar Teaspoon -- Nectar Cup -- Nectar Straw -- Thin Teaspoon -- Thin Cup -- Thin Straw -- Puree -- Mechanical Soft -- Regular -- Multi-consistency -- Pill -- Cervical Esophageal Comment -- CHL IP GO 09/20/2017 Functional Assessment Tool Used skilled clinical judgment Functional Limitations Memory Swallow Current Status (G2952) (None) Swallow Goal Status (W4132) (None) Swallow Discharge Status (G4010) (None) Motor  Speech Current Status (U7253) (None) Motor Speech Goal Status (G6440) (None) Motor Speech Goal Status (H4742) (None) Spoken Language Comprehension Current Status (V9563) (None)  Spoken Language Comprehension Goal Status [phone]) (None) Spoken Language Comprehension Discharge Status [phone]) (None) Spoken Language Expression Current Status [phone]) (None) Spoken Language Expression Goal Status [phone]) (None) Spoken Language Expression Discharge Status [phone]) (None) Attention Current Status (Z2248) (None) Attention Goal Status (G5003) (None) Attention Discharge Status [phone]) (None) Memory Current Status (Q9169) CJ Memory Goal Status (I5038) [place] Memory Discharge Status (U8280) CJ Voice Current Status (K3491) (None) Voice Goal Status (P9150) (None) Voice Discharge Status (V6979) (None) Other Speech-Language Pathology Functional Limitation Current Status (Y8016) (None) Other Speech-Language Pathology Functional Limitation Goal Status (P5374) (None) Other Speech-Language Pathology Functional Limitation Discharge Status [phone]) (None) Deneise Lever, [place], CCC-SLP Speech-Language Pathologist [phone] Aliene Altes 09/27/2017, 12:19 PM               Scheduled Meds: . ALPRAZolam  0.5 mg Oral QHS  . buPROPion  150 mg Oral BID  . carvedilol  12.5 mg Oral BID WC  . FLUoxetine  40 mg Oral Daily  . levothyroxine  50 mcg Oral QAC breakfast  . multivitamin with minerals  1 tablet Oral Daily  . pantoprazole  40 mg Oral BID  . potassium chloride SA  40 mEq Oral q morning - 10a  . pravastatin  20 mg Oral q1800  . timolol  1 drop Both Eyes BID    Continuous Infusions: . sodium chloride       LOS: 6 days     Nita Sells, MD [facility]   If 7PM-7AM, please contact night-coverage [email or web address] Password [email or web address] 09/27/2017, 1:10 PM

## 2017-09-27 NOTE — Progress Notes (Signed)
Modified Barium Swallow Progress Note  Patient Details  Name: [name] MRN: [id number] Date of Birth: 02-16-1923  Today's Date: 09/27/2017  Modified Barium Swallow completed.  Full report located under Chart Review in the Imaging Section.  Brief recommendations include the following:  Clinical Impression  Patient presents with moderate oropharyngeal dysphagia with sensory and motor impairments contributing to aspiration of thin and nectar thick liquids. Oral stage noted for prolonged oral transit, intermittent oral holding. Swallow initiation delayed to the level of the pyriform sinuses with thin, nectar and larger boluses of honey-thick liquids. This resulted in spillage from pyriforms with trace aspiration during the swallow with thin liquids. Reduced tongue base retraction and pharyngeal constriction resulting in residue in the valleculae, pyriform sinuses. Residue in pyriforms pooled with secretions, leading to trace aspiration after the swallow with thin, nectar and penetration with larger cup sips of honey. Teaspoon delivery confined residue to valleculae with smaller, thicker boluses (honey, puree, solids). While pt did sense aspiration and in some cases laryngeal penetration, her cough was either delayed or too weak to fully clear airway. Chin tuck was not effective for airway protection with thinner liquids, though with a dry swallow chin tuck reduced residue in the valleculae with teaspoons of puree, honey thick liquids. One instance of deep penetration of puree when taken with barium tablet due to oral discoordination with premature spillage. Pt ultimately retained barium tablet along her palate, requiring SLP to retrieve it. Recommend pt consume dys 2 solids with honey thick liquids via teaspoon only, full supervision, meds crushed in puree. She remains at mild-moderate aspiration risk due to residue. Effortful swallow with chin tuck intermittently to clear residue. SLP will f/u for  education and training in compensatory strategies. She may benefit from RMT for pharyngeal and cough strength.   Swallow Evaluation Recommendations       SLP Diet Recommendations: Dysphagia 2 (Fine chop) solids;Honey thick liquids   Liquid Administration via: Spoon;No straw   Medication Administration: Crushed with puree   Supervision: Full supervision/cueing for compensatory strategies   Compensations: Effortful swallow;Chin tuck;Multiple dry swallows after each bite/sip       Oral Care Recommendations: Oral care BID     [name], [place], [place] Speech-Language Pathologist [phone]   Aliene Altes 09/27/2017,12:18 PM

## 2017-09-27 NOTE — Progress Notes (Signed)
Pt left floor for swallow eval at 1034  Pt returned to unit at 1135

## 2017-09-27 NOTE — Progress Notes (Signed)
Pt sats 83 @ 1800. [place] at 2L applied. Daughter reports mild hallucinations last night. MD notified.

## 2017-09-28 ENCOUNTER — Inpatient Hospital Stay (HOSPITAL_COMMUNITY): Payer: Medicare Other

## 2017-09-28 DIAGNOSIS — I639 Cerebral infarction, unspecified: Secondary | ICD-10-CM

## 2017-09-28 LAB — BLOOD GAS, ARTERIAL
ACID-BASE EXCESS: 3 mmol/L — AB (ref 0.0–2.0)
Bicarbonate: 26.4 mmol/L (ref 20.0–28.0)
DRAWN BY: 441371
O2 Content: 1 L/min
O2 Saturation: 95.8 %
PH ART: 7.486 — AB (ref 7.350–7.450)
Patient temperature: 97.4
pCO2 arterial: 35 mmHg (ref 32.0–48.0)
pO2, Arterial: 71.5 mmHg — ABNORMAL LOW (ref 83.0–108.0)

## 2017-09-28 MED ORDER — SODIUM CHLORIDE 0.9 % IV SOLN
INTRAVENOUS | Status: DC
  Administered 2017-09-28: 11:00:00 via INTRAVENOUS

## 2017-09-28 MED ORDER — LEVOTHYROXINE SODIUM 100 MCG IV SOLR
25.0000 ug | Freq: Every day | INTRAVENOUS | Status: DC
  Administered 2017-09-28: 25 ug via INTRAVENOUS
  Filled 2017-09-28: qty 5

## 2017-09-28 MED ORDER — HALOPERIDOL LACTATE 5 MG/ML IJ SOLN: 0.5000 mg | INTRAMUSCULAR | Status: DC | PRN

## 2017-09-28 MED ORDER — ATROPINE SULFATE 1 % OP SOLN
4.0000 [drp] | OPHTHALMIC | Status: DC | PRN
  Filled 2017-09-28: qty 2

## 2017-09-28 MED ORDER — HALOPERIDOL 1 MG PO TABS: 0.5000 mg | ORAL_TABLET | ORAL | Status: DC | PRN

## 2017-09-28 MED ORDER — HALOPERIDOL LACTATE 2 MG/ML PO CONC
0.5000 mg | ORAL | Status: DC | PRN
  Filled 2017-09-28: qty 0.3

## 2017-09-28 MED ORDER — PANTOPRAZOLE SODIUM 40 MG IV SOLR
40.0000 mg | Freq: Two times a day (BID) | INTRAVENOUS | Status: DC
  Administered 2017-09-28: 40 mg via INTRAVENOUS
  Filled 2017-09-28: qty 40

## 2017-09-28 MED ORDER — GLYCOPYRROLATE 1 MG PO TABS
1.0000 mg | ORAL_TABLET | ORAL | Status: DC | PRN
  Filled 2017-09-28: qty 1

## 2017-09-28 MED ORDER — GLYCOPYRROLATE 0.2 MG/ML IJ SOLN
0.2000 mg | INTRAMUSCULAR | Status: DC | PRN
  Administered 2017-09-29 – 2017-09-30 (×4): 0.2 mg via INTRAVENOUS
  Filled 2017-09-28 (×3): qty 1

## 2017-09-28 MED ORDER — FUROSEMIDE 10 MG/ML IJ SOLN
40.0000 mg | Freq: Once | INTRAMUSCULAR | Status: AC
  Administered 2017-09-28: 40 mg via INTRAVENOUS
  Filled 2017-09-28: qty 4

## 2017-09-28 MED ORDER — MORPHINE SULFATE (PF) 2 MG/ML IV SOLN
1.0000 mg | INTRAVENOUS | Status: DC | PRN
  Administered 2017-09-28 – 2017-09-29 (×5): 1 mg via INTRAVENOUS
  Filled 2017-09-28 (×5): qty 1

## 2017-09-28 MED ORDER — NALOXONE HCL 0.4 MG/ML IJ SOLN
0.4000 mg | INTRAMUSCULAR | Status: DC | PRN
  Administered 2017-09-28: 0.4 mg via INTRAVENOUS
  Filled 2017-09-28: qty 1

## 2017-09-28 MED ORDER — GLYCOPYRROLATE 0.2 MG/ML IJ SOLN
0.2000 mg | INTRAMUSCULAR | Status: DC | PRN
Start: 2017-09-28 — End: 2017-10-01
  Filled 2017-09-28: qty 1

## 2017-09-28 MED ORDER — PIPERACILLIN-TAZOBACTAM IN DEX 2-0.25 GM/50ML IV SOLN
2.2500 g | Freq: Three times a day (TID) | INTRAVENOUS | Status: DC
  Administered 2017-09-28: 2.25 g via INTRAVENOUS
  Filled 2017-09-28 (×2): qty 50

## 2017-09-28 NOTE — Progress Notes (Signed)
Brief narrative: Patient is known to me from a previous consult this admission where she had a right MCA distribution infarct.  She has multivessel stenosis and was started on dual antiplatelet therapy.  Unfortunately, she developed a GI bleed and has therefore not been on any antithrombotics.   This morning, the patient was found unresponsive with a blown pupil.  Subjective: Patient is unresponsive  Exam: Vitals:   09/28/17 0946 09/28/17 1440  BP: (!) 150/48 (!) 185/59  Pulse: 62 68  Resp: (!) 24 (!) 22  Temp: (!) 97.4 F (36.3 C) 98.6 F (37 C)  SpO2: 98% 98%   Gen: In bed, NAD Resp: Lots of secretions and upper airway sounds Abd: soft, nt  Neuro: MS: Does not open eyes or follow commands CN: Right pupil is 3 mm and responsive, left pupil is 6 mm and sluggish, absent corneal on the left, intact on the right, face appears relatively symmetric Motor: She withdraws to noxious stimuli in all 4 extremities Sensory: As above DTR: 2+ and symmetric at the patella Cerebellar: She does not perform  Pertinent Labs: BMP-unremarkable  Impression: 84 year old female with acute mental status change. With blown pupil and absent corneal, I strongly suspect recurrent brainstem stroke, could be hemorrhagic or ischemic. Herniation from hemispheric stroke would also be possible to cause these findings, but her exam does not support this and this would be unusual at 89.   With this change in exam, I suspect that this has been a fairly serious setback for a 84 year old. I discussed treatment options including IR intervention with the daughter who indicated that the patient would not want aggressive measures. She is not a tPA candidate due to GI bleed. Family is considering comfort care.   Recommendations: 1) if continued aggressive care is desired, would repeat head CT and keep blood pressure less than 191 systolic if hemorrhagic.  Roland Rack, MD [facility] [phone]  If 7pm- 7am, please page neurology on call as listed in [place].

## 2017-09-28 NOTE — Progress Notes (Addendum)
Long discussion with daughter 2-3 x today Mother has not woken up and her mental status is drastically different from prior-her ABG is fine-she has not responded to Narcan My suspicion is she has probably had an extension of her stroke as she has a more dilated pupil on L>r compared to this am  She withdraws-pupils show anisocoria Thick gurgling respiraiton -gag weak GCS<8 abd soft Cannot do further Neuro assesment based on pt condisiton  I have run multiple scenarios by her daughter and we decided to see how things progress tonight--we both agreed that there is nothing we can really do or change in a setting of a recent GI bleed and she would be a poor candidate for any intervention if she did have an extension of her stroke--I gave education to daughter regarding maybe getting a CT scan of the brain to see, but again this wouldn't change outcome  We will call In Palliative care and Hopsice if she fails to recover in am Daughter understands clearly.  Verneita Griffes, MD [facility] [phone]

## 2017-09-28 NOTE — Progress Notes (Signed)
Pharmacy Antibiotic Note  [name] is a 84 y.o. female admitted on 09/18/2017 with pneumonia.  Pharmacy has been consulted for Zosyn dosing.  Patient with labored breathing and requiring suctioning. WBC up, hypothermic.  Plan: Zosyn 2.25g IV q8h- lower dosing d/t low body weight of patient and presumed worse renal function than what can be calculated Follow clinical progression, c/s, renal function, LOT  Height: 4\' 11"  (149.9 cm) Weight: 117 lb 11.6 oz (53.4 kg) IBW/kg (Calculated) : 43.2  Temp (24hrs), Avg:98.1 F (36.7 C), Min:97.4 F (36.3 C), Max:98.6 F (37 C)  Recent Labs  Lab 09/22/17 0546 09/23/17 0727 09/24/17 0557 09/25/17 0339 09/26/17 0346 09/27/17 0312  WBC 13.0* 12.7* 8.8  --   --  11.0*  CREATININE 1.05* 0.93 0.94 0.93 0.85 0.86    Estimated Creatinine Clearance: 31.2 mL/min (by C-G formula based on SCr of 0.86 mg/dL).    No Known Allergies  Antimicrobials this admission: Zosyn 1/7>>  Dose adjustments this admission: n/a  Microbiology results: n/a  Thank you for allowing pharmacy to be a part of this patient's care. Graciella Arment D. Elfreida Heggs, PharmD, BCPS Clinical Pharmacist Clinical Phone for 09/28/2017 until 3:30pm: [phone] If after 3:30pm, please call main pharmacy at [phone] 09/28/2017 9:53 AM

## 2017-09-28 NOTE — Progress Notes (Signed)
PROGRESS NOTE  [name] WNU:[id number] DOB: 10-30-1922 DOA: 09/18/2017 PCP: Lawerance Cruel, MD  HPI/Recap of past 24 hours:  89 fem htn, chf, hypothyroid, meningioma-came to ED 2/2 L side weak, L facial droop Admit 12/29 with Possible CVA.  Patient symptoms lasted for less than 3 minutes following which patient came back to baseline. In the ER patient was appearing nonfocal. CT head was showing possible meningioma but otherwise nothing acute.  EKG was showing sinus bradycardia.  Neurologist on-call was consulted and patient admitted for further management.   SUJ  Not responsive this am sats good Received multiple meds with potenttial for sedation [wellbutrin, ativan] 22:00 last pm   Assessment/Plan: Active Problems:   Intracranial vascular stenosis   Essential hypertension   Cerebrovascular accident (CVA) due to occlusion of right middle cerebral artery ([place])   Carotid occlusion, left   Stenosis of right carotid artery   Hyperlipidemia   Meningioma (HCC)   CVA (cerebral vascular accident) ([place])   Met encephalopathy probably 2/2 aspiration +Dysphagia NPO Keep npo convert MEds to IV and d/c all sedating agents Await ABG Hopeful patient will improve-daughter seen at bedside and confirms DNR status  #Right MCA acute infarct Pt currently stable MRI = right MCA moderate-sized infarct CTA head neck showed chr left ICA chronic occlusion, right M2 occlusion corresponding to right MCA infarct CT head: Stable 2.4 cm partially calcified right parasellar mass consistent with history of meningioma  ECHO: LVEF 65-70%, moderate LVH, normal wall motion, mild calcific aortic valve stenosis, mild MR, severe LAE, trivial TR, RVSP 66mHg, normal IVC A1c 5.1, LDL 75 Neurology rec's: Cardiac event monitor as outpt to r/o Afib, Continue DAPT for 3 months and then plavix alone (ASA 3241m+ Plavix 7528mAntiplatelet therapy held due to ??GIB-d/w Dr EdwOletta Lamasd okays re-start ASA at full  dose in maybe 10 days or so---enema contemplated-will hold-feel patient to frail to tolerate prep  #Acute drop in hgb??Upper GIB Hb is~ 8 and is stable Resolved coffee ground emesis, elevated BUN trending down, ++drop in hgb GI on board, EGD no obvious signs of bleeding/stigmata of recent bleed. Ordered FOBT. -could not transfuse as patient became acutely SOB Held DAPT, lovenox due to possible GIB Change meds to IV-received IV iron this admit  #Hypertension Stable Restarted home coreg, but decreased dose to 12.5 mg BID Held PTA lasix, lisinopril-controlled and would d/c moving forward  #Hypothyroidism TSH 3.515 Continue Synthroidas IV 25 mcg for now, TSH in 4 weeks  #History of iron deficiency anemia Continue iron supplements  #Depression Holding because of sedation wellbutrin 150 bid, xanax 0.5 pm and prozac 40 qd   Code Status: Full  Family Communication: None at bedside  Disposition Plan: need sNF d/c   Consultants:  Neurology  GI  Procedures:  EGD on 09/23/17  Antimicrobials:  None  DVT prophylaxis:  SCDs   Objective: Vitals:   09/28/17 0340 09/28/17 0614 09/28/17 0658 09/28/17 0946  BP:  (!) 184/63  (!) 150/48  Pulse:  66  62  Resp: (!) 24 (!) 26 (!) 26 (!) 24  Temp:  98.4 F (36.9 C)  (!) 97.4 F (36.3 C)  TempSrc:  Axillary  Axillary  SpO2:  97%  98%  Weight:      Height:       No intake or output data in the 24 hours ending 09/28/17 0954 Filed Weights   09/18/17 1813 09/18/17 2250  Weight: 55.3 kg (122 lb) 53.4 kg (117 lb 11.6  oz)    Exam:  Sleepy and non rousbale to voice.  stirs to manual stim-pupils 4 mm abd soft but slightly tender above umbilicus-small mass felt No LE edema Moving 4 limbs without deficit, reflexes benign   Data Reviewed: CBC: Recent Labs  Lab 09/22/17 0546 09/23/17 0727 09/24/17 0557 09/27/17 0312  WBC 13.0* 12.7* 8.8 11.0*  NEUTROABS 10.2* 9.6* 8.2* 8.3*  HGB 8.3* 7.5* 8.2* 8.4*  HCT 25.1* 23.2*  25.7* 27.1*  MCV 96.9 99.1 98.5 98.2  PLT 267 237 247 704   Basic Metabolic Panel: Recent Labs  Lab 09/23/17 0727 09/24/17 0557 09/25/17 0339 09/26/17 0346 09/27/17 0312  NA 140 137 137 137 138  K 4.0 3.7 3.6 3.6 4.2  CL 107 105 105 103 108  CO2 _0 GLUCOSE 81 157* 124* 108* 100*  BUN 27* 21* 24* 17 12  CREATININE 0.93 0.94 0.93 0.85 0.86  CALCIUM 8.7* 8.7* 8.9 8.7* 8.6*   GFR: Estimated Creatinine Clearance: 31.2 mL/min (by C-G formula based on SCr of 0.86 mg/dL). Liver Function Tests: No results for input(s): AST, ALT, ALKPHOS, BILITOT, PROT, ALBUMIN in the last 168 hours. No results for input(s): LIPASE, AMYLASE in the last 168 hours. No results for input(s): AMMONIA in the last 168 hours. Coagulation Profile: No results for input(s): INR, PROTIME in the last 168 hours. Cardiac Enzymes: No results for input(s): CKTOTAL, CKMB, CKMBINDEX, TROPONINI in the last 168 hours. BNP (last 3 results) No results for input(s): PROBNP in the last 8760 hours. HbA1C: No results for input(s): HGBA1C in the last 72 hours. CBG: No results for input(s): GLUCAP in the last 168 hours. Lipid Profile: No results for input(s): CHOL, HDL, LDLCALC, TRIG, CHOLHDL, LDLDIRECT in the last 72 hours. Thyroid Function Tests: No results for input(s): TSH, T4TOTAL, FREET4, T3FREE, THYROIDAB in the last 72 hours. Anemia Panel: No results for input(s): VITAMINB12, FOLATE, FERRITIN, TIBC, IRON, RETICCTPCT in the last 72 hours. Urine analysis:    Component Value Date/Time   COLORURINE YELLOW 09/23/2017 2008   APPEARANCEUR HAZY (A) 09/23/2017 2008   LABSPEC 1.010 09/23/2017 2008   PHURINE 6.0 09/23/2017 2008   GLUCOSEU NEGATIVE 09/23/2017 2008   HGBUR SMALL (A) 09/23/2017 2008   BILIRUBINUR NEGATIVE 09/23/2017 2008   KETONESUR NEGATIVE 09/23/2017 2008   PROTEINUR NEGATIVE 09/23/2017 2008   UROBILINOGEN 0.2 08/30/2010 1430   NITRITE NEGATIVE 09/23/2017 2008   LEUKOCYTESUR SMALL (A)  09/23/2017 2008   Sepsis Labs: _1 (procalcitonin:4,lacticidven:4)  )No results found for this or any previous visit (from the past 240 hour(s)).    Studies: Dg Chest Port 1 View  Result Date: 09/28/2017 CLINICAL DATA:  Short of breath.  Wheezing. EXAM: PORTABLE CHEST 1 VIEW COMPARISON:  09/26/2017 FINDINGS: Midline trachea. Cardiomegaly accentuated by AP portable technique. Atherosclerosis in the transverse aorta. Left larger than right pleural effusions are similar. No pneumothorax. Moderate interstitial edema is slightly increased. Left base airspace disease is similar and right base airspace disease is worsened. IMPRESSION: Slightly worsened aeration, with increased congestive failure and right base airspace disease. Left base airspace disease and bilateral pleural effusions are not significantly changed. Aortic Atherosclerosis (ICD10-I70.0). Electronically Signed   By: Abigail Miyamoto M.D.   On: 09/28/2017 07:48   Dg Swallowing Func-speech Pathology  Result Date: 09/27/2017 Objective Swallowing Evaluation: Type of Study: MBS-Modified Barium Swallow Study  Patient Details Name: ZYKERIA LAGUARDIA MRN: [id number] Date of Birth: July 04, 1923 Today's Date: 09/27/2017 Time: SLP Start Time (ACUTE ONLY): 27 -SLP  Stop Time (ACUTE ONLY): 1120 SLP Time Calculation (min) (ACUTE ONLY): 25 min Past Medical History: Past Medical History: Diagnosis Date . Anemia  . Arthritis  . History of umbilical hernia repair  . Hypertension  Past Surgical History: Past Surgical History: Procedure Laterality Date . APPENDECTOMY   . ESOPHAGOGASTRODUODENOSCOPY N/A 09/23/2017  Procedure: ESOPHAGOGASTRODUODENOSCOPY (EGD);  Surgeon: Laurence Spates, MD;  Location: [facility] ENDOSCOPY;  Service: Endoscopy;  Laterality: N/A; . HERNIA REPAIR   . TOTAL KNEE ARTHROPLASTY    right . TOTAL KNEE ARTHROPLASTY    left . UMBILICAL HERNIA REPAIR   HPI: KHLOI RAWL is a 84 y.o. female with history of hypertension, possibly CHF, hypothyroidism and meningioma  was brought to the ER after patient was found to have left-sided weakness. CT negative for acute changes, showed meningioma. 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 Seen by SLP for cognitive evaluation only as she passed stroke swallow screen. Pt had coffee ground emesis 12/30. 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 Large hiatal hernia seen on CXR 09/23/17. Referred for swallowing evaluation after RN noted coughing with meal.  Subjective: Pt in chair Assessment / Plan / Recommendation CHL IP CLINICAL IMPRESSIONS 09/27/2017 Clinical Impression Patient presents with moderate oropharyngeal dysphagia with sensory and motor impairments contributing to aspiration of thin and nectar thick liquids. Oral stage noted for prolonged oral transit, intermittent oral holding. Swallow initiation delayed to the level of the pyriform sinuses with thin, nectar and larger boluses of honey-thick liquids. This resulted in spillage from pyriforms with trace aspiration during the swallow with thin liquids. Reduced tongue base retraction and pharyngeal constriction resulting in residue in the valleculae, pyriform sinuses. Residue in pyriforms pooled with secretions, leading to trace aspiration after the swallow with thin, nectar and penetration with larger cup sips of honey. Teaspoon delivery confined residue to valleculae with smaller, thicker boluses (honey, puree, solids). While pt did sense aspiration and in some cases laryngeal penetration, her cough was either delayed or too weak to fully clear airway. Chin tuck was not effective for airway protection with thinner liquids, though with a dry swallow chin tuck reduced residue in the valleculae with teaspoons of puree, honey thick liquids. One instance of deep penetration of puree when taken with barium tablet due to  oral discoordination with premature spillage. Pt ultimately retained barium tablet along her palate, requiring SLP to retrieve it. Recommend pt consume dys 2 solids with honey thick liquids via teaspoon only, full supervision, meds crushed in puree. She remains at mild-moderate aspiration risk due to residue. Effortful swallow with chin tuck intermittently to clear residue. SLP will f/u for education and training in compensatory strategies. She may benefit from RMT for pharyngeal and cough strength. SLP Visit Diagnosis Dysphagia, oropharyngeal phase (R13.12) Attention and concentration deficit following -- Frontal lobe and executive function deficit following -- Impact on safety and function Mild aspiration risk;Moderate aspiration risk   CHL IP TREATMENT RECOMMENDATION 09/27/2017 Treatment Recommendations F/U MBS in --- days (Comment);Therapy as outlined in treatment plan below   Prognosis 09/27/2017 Prognosis for Safe Diet Advancement Good Barriers to Reach Goals -- Barriers/Prognosis Comment -- CHL IP DIET RECOMMENDATION 09/27/2017 SLP Diet Recommendations Dysphagia 2 (Fine chop) solids;Honey thick liquids Liquid Administration via Spoon;No straw Medication Administration Crushed with puree Compensations Effortful swallow;Chin tuck;Multiple dry swallows after each bite/sip Postural Changes --   CHL IP OTHER RECOMMENDATIONS 09/27/2017 Recommended  Consults -- Oral Care Recommendations Oral care BID Other Recommendations --   CHL IP FOLLOW UP RECOMMENDATIONS 09/27/2017 Follow up Recommendations Skilled Nursing facility   [facility] IP FREQUENCY AND DURATION 09/27/2017 Speech Therapy Frequency (ACUTE ONLY) min 2x/week Treatment Duration 2 weeks      CHL IP ORAL PHASE 09/27/2017 Oral Phase Impaired Oral - Pudding Teaspoon -- Oral - Pudding Cup -- Oral - Honey Teaspoon -- Oral - Honey Cup -- Oral - Nectar Teaspoon -- Oral - Nectar Cup -- Oral - Nectar Straw -- Oral - Thin Teaspoon -- Oral - Thin Cup -- Oral - Thin Straw -- Oral - Puree --  Oral - Mech Soft -- Oral - Regular -- Oral - Multi-Consistency -- Oral - Pill -- Oral Phase - Comment intermittent oral holding, prolonged oral transit, decreased bolus cohesion/premature spillage with mixed consistencies  CHL IP PHARYNGEAL PHASE 09/27/2017 Pharyngeal Phase Impaired Pharyngeal- Pudding Teaspoon -- Pharyngeal -- Pharyngeal- Pudding Cup -- Pharyngeal -- Pharyngeal- Honey Teaspoon Delayed swallow initiation-vallecula;Reduced tongue base retraction;Reduced pharyngeal peristalsis;Pharyngeal residue - valleculae;Compensatory strategies attempted (with notebox) Pharyngeal -- Pharyngeal- Honey Cup Delayed swallow initiation-pyriform sinuses;Reduced pharyngeal peristalsis;Reduced tongue base retraction;Penetration/Apiration after swallow;Pharyngeal residue - valleculae;Pharyngeal residue - pyriform Pharyngeal Material enters airway, remains ABOVE vocal cords and not ejected out Pharyngeal- Nectar Teaspoon Delayed swallow initiation-pyriform sinuses;Reduced pharyngeal peristalsis;Reduced tongue base retraction;Penetration/Apiration after swallow;Pharyngeal residue - valleculae;Pharyngeal residue - pyriform Pharyngeal Material enters airway, remains ABOVE vocal cords and not ejected out Pharyngeal- Nectar Cup Delayed swallow initiation-pyriform sinuses;Reduced pharyngeal peristalsis;Reduced tongue base retraction;Penetration/Aspiration during swallow;Trace aspiration;Pharyngeal residue - valleculae;Pharyngeal residue - pyriform Pharyngeal Material enters airway, passes BELOW cords and not ejected out despite cough attempt by patient Pharyngeal- Nectar Straw Delayed swallow initiation-pyriform sinuses;Reduced tongue base retraction;Penetration/Aspiration during swallow;Trace aspiration;Pharyngeal residue - pyriform;Pharyngeal residue - valleculae Pharyngeal Material enters airway, passes BELOW cords and not ejected out despite cough attempt by patient Pharyngeal- Thin Teaspoon Delayed swallow initiation-pyriform  sinuses;Reduced pharyngeal peristalsis;Reduced tongue base retraction;Penetration/Aspiration during swallow;Trace aspiration;Pharyngeal residue - valleculae;Pharyngeal residue - pyriform Pharyngeal Material enters airway, passes BELOW cords and not ejected out despite cough attempt by patient Pharyngeal- Thin Cup Delayed swallow initiation-pyriform sinuses;Reduced pharyngeal peristalsis;Reduced tongue base retraction;Penetration/Aspiration during swallow;Trace aspiration;Pharyngeal residue - valleculae;Pharyngeal residue - pyriform Pharyngeal Material enters airway, passes BELOW cords and not ejected out despite cough attempt by patient Pharyngeal- Thin Straw -- Pharyngeal -- Pharyngeal- Puree Delayed swallow initiation-vallecula;Reduced pharyngeal peristalsis;Reduced tongue base retraction;Pharyngeal residue - valleculae;Other (Comment) Pharyngeal -- Pharyngeal- Mechanical Soft -- Pharyngeal -- Pharyngeal- Regular Delayed swallow initiation-vallecula;Reduced pharyngeal peristalsis;Reduced tongue base retraction;Pharyngeal residue - valleculae Pharyngeal -- Pharyngeal- Multi-consistency -- Pharyngeal -- Pharyngeal- Pill NT Pharyngeal -- Pharyngeal Comment --  CHL IP CERVICAL ESOPHAGEAL PHASE 09/27/2017 Cervical Esophageal Phase WFL Pudding Teaspoon -- Pudding Cup -- Honey Teaspoon -- Honey Cup -- Nectar Teaspoon -- Nectar Cup -- Nectar Straw -- Thin Teaspoon -- Thin Cup -- Thin Straw -- Puree -- Mechanical Soft -- Regular -- Multi-consistency -- Pill -- Cervical Esophageal Comment -- CHL IP GO 09/20/2017 Functional Assessment Tool Used skilled clinical judgment Functional Limitations Memory Swallow Current Status (Z6109) (None) Swallow Goal Status (U0454) (None) Swallow Discharge Status (U9811) (None) Motor Speech Current Status (B1478) (None) Motor Speech Goal Status (G9562) (None) Motor Speech Goal Status (Z3086) (None) Spoken Language Comprehension Current Status (V7846) (None) Spoken Language Comprehension Goal  Status (N6295) (None) Spoken Language Comprehension Discharge Status [phone]) (None) Spoken Language Expression Current Status (K4401) (None) Spoken Language Expression Goal Status (U2725) (None) Spoken Language Expression Discharge Status [phone]) (None)  Attention Current Status [phone]) (None) Attention Goal Status (N5396) (None) Attention Discharge Status [phone]) (None) Memory Current Status (V1504) CJ Memory Goal Status (H3643) [place] Memory Discharge Status (I3779) CJ Voice Current Status (Z9688) (None) Voice Goal Status (A4847) (None) Voice Discharge Status (U0721) (None) Other Speech-Language Pathology Functional Limitation Current Status (C2883) (None) Other Speech-Language Pathology Functional Limitation Goal Status (D7445) (None) Other Speech-Language Pathology Functional Limitation Discharge Status [phone]) (None) Deneise Lever, MS, CCC-SLP Speech-Language Pathologist [phone] Aliene Altes 09/27/2017, 12:19 PM               Scheduled Meds: . carvedilol  12.5 mg Oral BID WC  . levothyroxine  50 mcg Oral QAC breakfast  . multivitamin with minerals  1 tablet Oral Daily  . pantoprazole sodium  40 mg Oral Daily  . potassium chloride  40 mEq Oral q morning - 10a  . pravastatin  20 mg Oral q1800  . timolol  1 drop Both Eyes BID    Continuous Infusions: . sodium chloride    . piperacillin-tazobactam (ZOSYN)  IV       LOS: 7 days     Nita Sells, MD [facility]   If 7PM-7AM, please contact night-coverage [email or web address] Password [facility] 09/28/2017, 9:54 AM

## 2017-09-28 NOTE — Progress Notes (Signed)
PT Cancellation Note  Patient Details Name: [name] MRN: [id number] DOB: 1922/11/04   Cancelled Treatment:    Reason Eval/Treat Not Completed: Medical issues which prohibited therapy Per nurse, pt has been unresponsive all day and unable to arouse. PT will follow as time allows.   Judee Clara 09/28/2017, 2:52 PM

## 2017-09-28 NOTE — Progress Notes (Signed)
Nurse got in report that patient became unresponsive around 6am this morning, rapid was called and would monitor.  Pt has continued to be unresponsive for me as well. I have tried a sternal rub on the patient and still  No response.   Rales and expiratory lung sounds bilaterally. Pt. Sat at 95% on 1 liter of oxygen.  Will notify MD.

## 2017-09-28 NOTE — Significant Event (Addendum)
Rapid Response Event Note RN called for pt less responsive and using accessory muscles, "gorgling sound"  Overview: Time Called: 0616 Arrival Time: 0618 Event Type: Respiratory  Initial Focused Assessment: On arrival pt lying supine in bed, not responsive to verbal stimuli, arousal to sternal rub, unable to stay awake. RT at bedside obtained copious amounts of thick yellow secretions by NTS. Pt with wheezing and crackles, worse on Right side.  BP 184/63, HR 66, RR 24, 100% 1.5 L [employer]  Interventions: CXR, increased congestive failure and Right base airspace disease, slightly worsened aeration. No change in Left base airspace disease and bilateral pleural effusions.  Lasix 40 mg IVP Duoneb  Plan of Care (if not transferred): Continue to monitor and re-evaluate after CXR RN advised to call RRT as needed.  Event Summary: Name of Physician Notified: Bl[place] NP  at (PTA RRT )    at    Outcome: Stayed in room and stabalized     [place], [place]

## 2017-09-28 NOTE — Progress Notes (Signed)
D/w Dr. Leonel Ramsay and Daughter-Will transition to EOL care-transfer to 6n.  Start comfort measures-unlikely will survive this admission

## 2017-09-28 NOTE — Progress Notes (Signed)
Pt with loud gurgling breath sound. Unresponsive to voice. Doesn't open eyes. BL lungs sound crackles. Labored breathing. RR 26. O2 sat 97 on 1.5L. VS in flowsheet.  RT paged. Deep suctioning with thick secretion . Breathing treatment administered. Rapid response at bedside. Dr. Kennon Holter notified with order of lasix and repeat CXR. Dtr [place]) made aware.

## 2017-09-28 NOTE — Progress Notes (Signed)
Nurse informed MD during rounds Of pt. Status. He order a ABG and said to continue to monitor.

## 2017-09-28 NOTE — Progress Notes (Signed)
Nurse gave Narcan Per MD Samtani. Patient did not have any response.Pt. still unable to arouse.   MD notify, He then spoke with the Daughter directly about patient care moving forward.  Pt. Left pupil is 2mm non reactive and Right pupil is 22mm Non reactive.  Unable to do a assessment due to Pt. Current status.   Will continue to monitor.

## 2017-09-28 NOTE — Progress Notes (Signed)
SLP Cancellation Note  Patient Details Name: [name] MRN: [id number] DOB: June 16, 1923   Cancelled treatment:       Reason Eval/Treat Not Completed: Medical issues which prohibited therapy. Pt not alert for assessment, recommended to RN that pt be made NPO. Dysphagia reported to be quite severe on MBS. Should not consume any PO until she can be reassessed by SLP.   Herbie [place], [place] CCC-SLP [phone]  Lynann Beaver 09/28/2017, 9:51 AM

## 2017-09-29 ENCOUNTER — Inpatient Hospital Stay (HOSPITAL_COMMUNITY): Payer: Medicare Other

## 2017-09-29 NOTE — Progress Notes (Signed)
I discussed CT results with daughter earlier, with clinical signs of herniation from MCA stroke in a 84 yo, I think that purely palliative measures are appropriate.   I discussed other interventions such as hypertonic saline, craniectomy, etc. with daughter, but she feels that her mother would not want aggressive interventions if she would be expected to be dependant in a best case scenario.   Continue comfort measures. Please call if neurology can be of further assistance.   Roland Rack, MD [facility] [phone]  If 7pm- 7am, please page neurology on call as listed in [place].

## 2017-09-29 NOTE — Progress Notes (Addendum)
Patient continues to be obtunded. L Pupil actually more reactive today, but still anisocoric. 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 She appears to extensor posture her left arm, but withdraws the left leg. She withdraws right arm and leg.   I continue to suspect an intracranial catastrophe, e.g. ICH. Ischemic stroke could be possible, but I feel is less likely. CT scan could be used to confirm diagnosis, but unless she improves, unlikely to actually change management or prognosis. I doubt that her intracerebral process will progress much, but with her current respiratory status I doubt that she will survive terribly long.   Please call if neurology can be of further assistance or if patient improves.   Roland Rack, MD [facility] [phone]  If 7pm- 7am, please page neurology on call as listed in [place].

## 2017-09-29 NOTE — Progress Notes (Signed)
PROGRESS NOTE  [name] HEN:[id number] DOB: [birth year] DOA: 09/18/2017 PCP: Lawerance Cruel, MD  HPI/Recap of past 24 hours:  89 fem htn, chf, hypothyroid, meningioma-came to ED 2/2 L side weak, L facial droop Admit 12/29 with Possible CVA.  Patient symptoms lasted for less than 3 minutes following which patient came back to baseline. In the ER patient was appearing nonfocal. CT head was showing possible meningioma but otherwise nothing acute.  EKG was showing sinus bradycardia.  Neurologist on-call was consulted and patient admitted for further management.   SUJ  Non responsive and non communicative   Assessment/Plan: Active Problems:   Intracranial vascular stenosis   Essential hypertension   Cerebrovascular accident (CVA) due to occlusion of right middle cerebral artery (HCC)   Carotid occlusion, left   Stenosis of right carotid artery   Hyperlipidemia   Meningioma (HCC)   CVA (cerebral vascular accident) ([place])   Met encephalopathy probably 2/2 aspiration +Dysphagia NPO Poor likely outcome Getting CT scan head to assess if hemorrhagic cva-tx to 6n Needs palliative bed if survives beyond the next 48 hr  #Right MCA acute infarct MRI = right MCA moderate-sized infarct CTA head neck showed chr left ICA chronic occlusion, right M2 occlusion corresponding to right MCA infarct CT head: Stable 2.4 cm partially calcified right parasellar mass consistent with history of meningioma  ECHO: LVEF 65-70%, moderate LVH, normal wall motion, mild calcific aortic valve stenosis, mild MR, severe LAE, trivial TR, RVSP 9mHg, normal IVC A1c 5.1, LDL 75 Overall very por outcome-transfer to 6n for EOL care  #Acute drop in hgb??Upper GIB Hb is~ 8 and is stable No further work-up  #Hypertension Comfort care  #Hypothyroidism TSH 3.515 Comfort care  #History of iron deficiency anemia Continue iron supplements  #Depression eol order set   Code Status: Full  Family  Communication: None at bedside  Disposition Plan: need sNF d/c   Consultants:  Neurology  GI  Procedures:  EGD on 09/23/17  Antimicrobials:  None  DVT prophylaxis:  SCDs   Objective: Vitals:   09/28/17 1440 09/28/17 1723 09/29/17 0014 09/29/17 0921  BP: (!) 185/59 (!) 209/91 (!) 133/53 (!) 195/82  Pulse: 68 83 60 77  Resp: (!) 22 (!) 22 20 (!) 21  Temp: 98.6 F (37 C)  98.2 F (36.8 C) 98.7 F (37.1 C)  TempSrc: Oral Oral Oral Axillary  SpO2: 98% 98% 94% 97%  Weight:      Height:        Intake/Output Summary (Last 24 hours) at 09/29/2017 1348 Last data filed at 09/29/2017 0900 Gross per 24 hour  Intake 0 ml  Output -  Net 0 ml   Filed Weights   09/18/17 1813 09/18/17 2250  Weight: 55.3 kg (122 lb) 53.4 kg (117 lb 11.6 oz)    Exam:  Non rousable-pupils a little more reactive on L side cta b abd soft ext still warm   Data Reviewed: CBC: Recent Labs  Lab 09/23/17 0727 09/24/17 0557 09/27/17 0312  WBC 12.7* 8.8 11.0*  NEUTROABS 9.6* 8.2* 8.3*  HGB 7.5* 8.2* 8.4*  HCT 23.2* 25.7* 27.1*  MCV 99.1 98.5 98.2  PLT 237 247 3361  Basic Metabolic Panel: Recent Labs  Lab 09/23/17 0727 09/24/17 0557 09/25/17 0339 09/26/17 0346 09/27/17 0312  NA 140 137 137 137 138  K 4.0 3.7 3.6 3.6 4.2  CL 107 105 105 103 108  CO2 25 25 25 26 23   GLUCOSE 81 157* 124* 108*  100*  BUN 27* 21* 24* 17 12  CREATININE 0.93 0.94 0.93 0.85 0.86  CALCIUM 8.7* 8.7* 8.9 8.7* 8.6*   GFR: Estimated Creatinine Clearance: 31.2 mL/min (by C-G formula based on SCr of 0.86 mg/dL). Liver Function Tests: No results for input(s): AST, ALT, ALKPHOS, BILITOT, PROT, ALBUMIN in the last 168 hours. No results for input(s): LIPASE, AMYLASE in the last 168 hours. No results for input(s): AMMONIA in the last 168 hours. Coagulation Profile: No results for input(s): INR, PROTIME in the last 168 hours. Cardiac Enzymes: No results for input(s): CKTOTAL, CKMB, CKMBINDEX, TROPONINI in the  last 168 hours. BNP (last 3 results) No results for input(s): PROBNP in the last 8760 hours. HbA1C: No results for input(s): HGBA1C in the last 72 hours. CBG: No results for input(s): GLUCAP in the last 168 hours. Lipid Profile: No results for input(s): CHOL, HDL, LDLCALC, TRIG, CHOLHDL, LDLDIRECT in the last 72 hours. Thyroid Function Tests: No results for input(s): TSH, T4TOTAL, FREET4, T3FREE, THYROIDAB in the last 72 hours. Anemia Panel: No results for input(s): VITAMINB12, FOLATE, FERRITIN, TIBC, IRON, RETICCTPCT in the last 72 hours. Urine analysis:    Component Value Date/Time   COLORURINE YELLOW 09/23/2017 2008   APPEARANCEUR HAZY (A) 09/23/2017 2008   LABSPEC 1.010 09/23/2017 2008   PHURINE 6.0 09/23/2017 2008   GLUCOSEU NEGATIVE 09/23/2017 2008   HGBUR SMALL (A) 09/23/2017 2008   BILIRUBINUR NEGATIVE 09/23/2017 2008   KETONESUR NEGATIVE 09/23/2017 2008   PROTEINUR NEGATIVE 09/23/2017 2008   UROBILINOGEN 0.2 08/30/2010 1430   NITRITE NEGATIVE 09/23/2017 2008   LEUKOCYTESUR SMALL (A) 09/23/2017 2008   Sepsis Labs: @LABRCNTIP (procalcitonin:4,lacticidven:4)  )No results found for this or any previous visit (from the past 240 hour(s)).    Studies: No results found.  Scheduled Meds: . timolol  1 drop Both Eyes BID    Continuous Infusions: . sodium chloride 50 mL/hr at 09/28/17 1100     LOS: 8 days     Nita Sells, MD [facility]   If 7PM-7AM, please contact night-coverage [email or web address] Password [email or web address] 09/29/2017, 1:48 PM

## 2017-09-29 NOTE — Care Management Important Message (Signed)
Important Message  Patient Details  Name: [name] MRN: [id number] Date of Birth: 14-Jan-1923   Medicare Important Message Given:  Yes    [name] 09/29/2017, 10:15 AM

## 2017-09-29 NOTE — Progress Notes (Signed)
OT Cancellation Note  Patient Details Name: [name] MRN: [id number] DOB: 1923-07-28   Cancelled Treatment:    Reason Eval/Treat Not Completed: (Pt transitioning to comfort care. OT signing off.)  Malka So 09/29/2017, 8:14 AM  09/29/2017 Nestor Lewandowsky, OTR/L Pager: [phone]

## 2017-09-29 NOTE — Progress Notes (Signed)
SLP Cancellation Note  Patient Details Name: [name] MRN: [id number] DOB: Apr 02, 1923   Cancelled treatment:       Reason Eval/Treat Not Completed: Other (comment). Pt transitioning to comfort measures after a change in status. Will sign off   Latricia Cerrito, Katherene Ponto 09/29/2017, 7:27 AM

## 2017-09-29 NOTE — Progress Notes (Signed)
PT Cancellation Note  Patient Details Name: [name] MRN: [id number] DOB: 22-Nov-1922   Cancelled Treatment:    Reason Eval/Treat Not Completed: Other (comment) Pt transitioning to comfort measures after a change in status. PT signing off.     Jennyfer Nickolson M Che Rachal 09/29/2017, 8:34 AM   Kittie Plater, PT, DPT Pager #: [phone] Office #: [phone]

## 2017-09-30 MED ORDER — MORPHINE SULFATE (PF) 4 MG/ML IV SOLN
1.0000 mg | INTRAVENOUS | Status: DC | PRN
  Administered 2017-10-01 (×2): 1 mg via INTRAVENOUS
  Filled 2017-09-30 (×2): qty 1

## 2017-09-30 NOTE — Progress Notes (Signed)
CSW following to facilitate discharge planning. CSW checked in with patient's daughter at bedside to discuss residential hospice placement. CSW explained that it would probably be more comfortable for patient and family, as it's a quieter, calmer atmosphere and there's more available staff for the patient's end of life needs. Patient's daughter was agreeable, and requested [facility] as it was closest to home.   CSW contacted Admissions with [facility] to check on bed availability and to send referral for residential hospice placement. Per Admissions at [facility], there are no beds available today, but they have accepted referral and will update with availability.  CSW will continue to follow.  Laveda Abbe, [place] Clinical Social Worker [phone]

## 2017-09-30 NOTE — Progress Notes (Signed)
PROGRESS NOTE   [name]  FVC:[id number]    DOB: 01-30-1923    DOA: 09/18/2017  PCP: Lawerance Cruel, MD   I have briefly reviewed patients [facility].  Brief Narrative:  84 year old female with PMH of HTN, possible CHF, hypothyroidism meningioma is brought to the ED on 09/19/17 with left-sided weakness. She was found to have acute stroke. Neurology was consulted.   Assessment & Plan:   Active Problems:   Intracranial vascular stenosis   Essential hypertension   Cerebrovascular accident (CVA) due to occlusion of right middle cerebral artery (HCC)   Carotid occlusion, left   Stenosis of right carotid artery   Hyperlipidemia   Meningioma (HCC)   CVA (cerebral vascular accident) ([place])   1. Acute stroke: Right MCA moderate sized infarct, suspected cardioembolic versus right ICA atherosclerosis. Neurology was consulted and patient completed extensive stroke workup. She had actually been discharged on 09/20/17 with plans for 3 months of dual antiplatelets, home health, outpatient 30 day event monitor and neurology follow-up. She then developed coffee-ground emesis with drop in hemoglobin. [facility] was consulted. Antiplatelets were held. Course complicated by large stroke with mass effect. 2. Acute upper GI bleed: [facility] was consulted. EGD 1/2 was normal. 3. Acute blood loss anemia/iron deficiency anemia: Transfused PRBCs but apparently reacted to transfusion and had to be terminated. Held dual antiplatelets. GI were hesitant to do colonoscopy in this elderly frail patient who was quite tenuous medically. PPI added. Received IV Feraheme. 4. Acute encephalopathy: Noted on 09/28/17. Initially suspected due to dysphagia related aspiration. Mental status significantly worse/acute mental status change. Repeat head CT 1/8 showed very large nonhemorrhagic right MCA stroke with midline shift to left of approximately 8 mm. Extensively discussed with family and  transition to EOL Care. Awaiting residential hospice. 5. Dysphagia: Likely related to stroke. 6. Extra and intracranial stenosis: 7. Essential hypertension: 8. Hyperlipidemia: 9. Meningioma: 10. Hypothyroid: TSH 3.515.   DVT prophylaxis: None. Comfort care. Code Status: DO NOT RESUSCITATE Family Communication: Discussed with daughter at bedside. Disposition: DC to residential hospice when bed available.   Consultants:  Neurology   Procedures:  EGD  Antimicrobials:  None    Subjective: Unresponsive. Discussed with daughter at bedside, received pain medications for some tachypnea. No verbal, motor or right response.   ROS: As above, unable  Objective:  Vitals:   09/29/17 0921 09/29/17 1408 09/29/17 1748 09/29/17 2128  BP: (!) 195/82 (!) 185/82 (!) 170/72 (!) 167/76  Pulse: 77 82 76 81  Resp: (!) 21 (!) 22 (!) 23 18  Temp: 98.7 F (37.1 C) 97.9 F (36.6 C) 98.8 F (37.1 C) 98.6 F (37 C)  TempSrc: Axillary Axillary Oral Oral  SpO2: 97% 99% 98% 97%  Weight:      Height:        Examination:  General exam: Elderly female, lying comfortably in bed. Respiratory system: Slightly gurgly respiration and mild tachypnea. Diminished sounds bilaterally. Cardiovascular system: S1 & S2 heard, RRR. No JVD, murmurs, rubs, gallops or clicks. No pedal edema. Gastrointestinal system: Abdomen is nondistended, soft and nontender. No organomegaly or masses felt. Normal bowel sounds heard. Central nervous system: Unresponsive.     Data Reviewed: I have personally reviewed following labs and imaging studies  CBC: Recent Labs  Lab 09/24/17 0557 09/27/17 0312  WBC 8.8 11.0*  NEUTROABS 8.2* 8.3*  HGB 8.2* 8.4*  HCT 25.7* 27.1*  MCV 98.5 98.2  PLT 247 361   Basic  Metabolic Panel: Recent Labs  Lab 09/24/17 0557 09/25/17 0339 09/26/17 0346 09/27/17 0312  NA 137 137 137 138  K 3.7 3.6 3.6 4.2  CL 105 105 103 108  CO2 25 25 26 23   GLUCOSE 157* 124* 108* 100*  BUN 21*  24* 17 12  CREATININE 0.94 0.93 0.85 0.86  CALCIUM 8.7* 8.9 8.7* 8.6*        Radiology Studies: Ct Head Wo Contrast  Result Date: 09/29/2017 CLINICAL DATA:  84 year old with a subacute right middle cerebral artery distribution stroke, initially diagnosed on 09/19/2017. Patient is currently unresponsive. Follow-up. EXAM: CT HEAD WITHOUT CONTRAST TECHNIQUE: Contiguous axial images were obtained from the base of the skull through the vertex without intravenous contrast. COMPARISON:  MRI brain 09/19/2017, 08/11/2014. CT head 09/19/2017, 09/18/2017, 08/11/2014 and earlier. CTA head 09/19/2017. FINDINGS: Brain: Very large right middle cerebral artery distribution stroke with edema throughout the right frontal lobe, temporal lobe and parietal lobe, resulting in effacement of cortical sulci, compression of the right lateral ventricle, and shift of the midline to the left of approximately 8 mm. No evidence of associated hemorrhage or hematoma. Uncal herniation may be present, though is difficult to state with certainty due to the presence of the previously identified partially calcified approximate 2.5 cm meningioma arising from the right sphenoid bone. Fourth ventricle is midline. No evidence of hydrocephalus. Mild changes of small vessel disease of the white matter throughout the left cerebral hemisphere. No new parenchymal abnormalities involving the remainder of the brain. Vascular: Hyperdense right middle cerebral artery. Severe bilateral carotid siphon atherosclerosis. Skull: No skull fracture or other focal osseous abnormality involving the skull. Sinuses/Orbits: Mucosal thickening and air-fluid level involving the right sphenoid sinus, new since 09/19/2017. Remaining paranasal sinuses, bilateral mastoid air cells and bilateral middle ear cavities well-aerated. Hypoplastic frontal sinuses as noted previously. Visualized orbits and globes intact. Other: None. IMPRESSION: 1. Very large nonhemorrhagic right  middle cerebral artery distribution stroke with midline shift to the left of approximately 8 mm. 2. Partially calcified approximate 2.5 cm right sphenoid meningioma as noted previously. 3. Acute right sphenoid sinusitis. Electronically Signed   By: Evangeline Dakin M.D.   On: 09/29/2017 14:12        Scheduled Meds: . timolol  1 drop Both Eyes BID   Continuous Infusions: . sodium chloride 50 mL/hr at 09/28/17 1100     LOS: 9 days     Vernell Leep, MD, [place], [facility]. [facility] Pager [phone]  If 7PM-7AM, please contact night-coverage [email or web address] Password [facility] 09/30/2017, 6:12 PM

## 2017-10-01 DIAGNOSIS — K922 Gastrointestinal hemorrhage, unspecified: Secondary | ICD-10-CM

## 2017-10-01 DIAGNOSIS — D62 Acute posthemorrhagic anemia: Secondary | ICD-10-CM

## 2017-10-01 MED ORDER — MORPHINE SULFATE (CONCENTRATE) 10 MG /0.5 ML PO SOLN: 5.0000 mg | Freq: Four times a day (QID) | ORAL | Status: AC | PRN

## 2017-10-01 MED ORDER — GLYCOPYRROLATE 1 MG PO TABS: 1.0000 mg | ORAL_TABLET | ORAL | Status: AC | PRN

## 2017-10-01 NOTE — Social Work (Signed)
[facility] is able to accept pt for transfer today.   CSW spoke with admissions liaison and they will come to hospital to complete paperwork with pt family.   CSW will alert MD, and continue to follow to support transfer to [facility] when medically appropriate.   Alexander Mt, [place] Work [phone]

## 2017-10-01 NOTE — Progress Notes (Signed)
Discharged to [facility] via EMS. Report given to the RN at above facility. Daughter in and will follow at the place. Personal belongings taken by daughter

## 2017-10-01 NOTE — Social Work (Addendum)
Clinical Social Worker facilitated patient discharge including contacting patient family and facility to confirm patient discharge plans.  Discharge summary given to [facility], hospice facility liaison and family agreeable with plan.  CSW arranged ambulance transport via [facility] to [place].  RN to call [phone] with report prior to discharge.  Clinical Social Worker will sign off for now as social work intervention is no longer needed. Please consult [place] again if new need arises.  Alexander Mt, [place] Social Worker

## 2017-10-01 NOTE — Progress Notes (Signed)
Nutrition Brief Note  Chart reviewed. Pt now transitioning to comfort care.  No further nutrition interventions warranted at this time.  Please re-consult as needed.   Cristabel Bicknell A. Murad Staples, RD, LDN, CDE Pager: [phone] After hours Pager: [phone]  

## 2017-10-01 NOTE — Discharge Summary (Addendum)
Physician Discharge Summary  [name] LKG:[id number] DOB: Jan 08, 1923  PCP: Lawerance Cruel, MD  Admit date: 09/18/2017 Discharge date: 10/01/2017  Recommendations for Outpatient Follow-up:  1. MD at [facility] regarding ongoing hospice care.   Home Health: N/A Equipment/Devices: N/A    Discharge Condition: Guarded and expected to decline including death.  CODE STATUS: DO NOT RESUSCITATE  Diet recommendation: Comfort feeds of choice.  Discharge Diagnoses:  Active Problems:   Intracranial vascular stenosis   Essential hypertension   Cerebrovascular accident (CVA) due to occlusion of right middle cerebral artery (HCC)   Carotid occlusion, left   Stenosis of right carotid artery   Hyperlipidemia   Meningioma (HCC)   CVA (cerebral vascular accident) [facility])   Brief Summary: 84 year old female with PMH of HTN, possible CHF, hypothyroidism meningioma was brought to the ED on 09/19/17 with left-sided weakness. She was found to have acute stroke. Neurology was consulted. She had initially improved and plan was to discharge her home. She then sustained acute blood loss anemia from suspected GI bleed. Course then complicated by recurrent acute large stroke with mass effect. At this point prognosis was deemed extremely poor and patient was transitioned to full comfort care after discussing with family.   Assessment & Plan:   1. Acute stroke: Right MCA moderate sized infarct, suspected cardioembolic versus right ICA atherosclerosis. Neurology was consulted and patient completed extensive stroke workup. She had actually been discharged on 09/20/17 with plans for 3 months of dual antiplatelets, home health, outpatient 30 day event monitor and neurology follow-up. She then developed coffee-ground emesis with drop in hemoglobin. [facility] was consulted. Antiplatelets were held. Course complicated by new large stroke with mass effect. This has left her obtunded. Neurology and  hospitalist team discussed with family at length regarding extremely poor prognosis. Patient was then transitioned to end-of-life comfort care/hospice. 2. Acute upper GI bleed: [facility] was consulted. EGD 1/2 was normal. 3. Acute blood loss anemia/iron deficiency anemia: Transfused PRBCs but apparently reacted to transfusion and had to be terminated. Held dual antiplatelets. GI were hesitant to do colonoscopy in this elderly frail patient who was quite tenuous medically. Received IV Feraheme. 4. Acute encephalopathy: Noted on 09/28/17. Initially suspected due to dysphagia related aspiration. Mental status significantly worse/acute mental status change. Repeat head CT 1/8 showed very large nonhemorrhagic right MCA stroke with midline shift to left of approximately 8 mm. Extensively discussed with family and transitioned to EOL Care. Discharging to residential hospice. 5. Dysphagia: Likely related to stroke. 6. Extra and intracranial stenosis: 7. Essential hypertension: 8. Hyperlipidemia: 9. Meningioma: 10. Hypothyroid: TSH 3.515.    Consultants:  Neurology   Procedures:  EGD  Discharge Instructions  Discharge Instructions    Call MD for:  difficulty breathing, headache or visual disturbances   Complete by:  As directed    Call MD for:  severe uncontrolled pain   Complete by:  As directed    Diet general   Complete by:  As directed    Comfort feeds of choice.   Increase activity slowly   Complete by:  As directed        Medication List    STOP taking these medications   ALPRAZolam 0.5 MG tablet Commonly known as:  XANAX   aspirin 81 MG tablet   buPROPion 150 MG 12 hr tablet Commonly known as:  WELLBUTRIN SR   carvedilol 25 MG tablet Commonly known as:  COREG   esomeprazole 40 MG capsule Commonly  known as:  NEXIUM   ferrous sulfate 325 (65 FE) MG tablet   FLUoxetine 40 MG capsule Commonly known as:  PROZAC   furosemide 40 MG tablet Commonly known as:  LASIX    levothyroxine 50 MCG tablet Commonly known as:  SYNTHROID, LEVOTHROID   lisinopril 40 MG tablet Commonly known as:  PRINIVIL,ZESTRIL   ONE-A-DAY WOMENS FORMULA Tabs   potassium chloride SA 20 MEQ tablet Commonly known as:  K-DUR,KLOR-CON   timolol 0.5 % ophthalmic solution Commonly known as:  TIMOPTIC   Vitamin D-3 1000 units Caps     TAKE these medications   glycopyrrolate 1 MG tablet Commonly known as:  ROBINUL Take 1 tablet (1 mg total) by mouth every 4 (four) hours as needed (excessive secretions).   morphine CONCENTRATE 10 mg / 0.5 ml concentrated solution Place 0.25 mLs (5 mg total) under the tongue every 6 (six) hours as needed for moderate pain, severe pain, anxiety or shortness of breath.      Follow-up Information    MD with [facility] Follow up.   Why:  Follow up re ongoing Hospice care.         No Known Allergies    Procedures/Studies: Ct Angio Head W Or Wo Contrast  Result Date: 09/19/2017 CLINICAL DATA:  Right frontal lobe infarct.  TIA symptoms. EXAM: CT ANGIOGRAPHY HEAD AND NECK TECHNIQUE: Multidetector CT imaging of the head and neck was performed using the standard protocol during bolus administration of intravenous contrast. Multiplanar CT image reconstructions and MIPs were obtained to evaluate the vascular anatomy. Carotid stenosis measurements (when applicable) are obtained utilizing NASCET criteria, using the distal internal carotid diameter as the denominator. CONTRAST:  44mL ISOVUE-370 IOPAMIDOL (ISOVUE-370) INJECTION 76% COMPARISON:  MRI brain from the same day. CT head without contrast 09/18/2017 FINDINGS: CT HEAD FINDINGS Brain: The anterior right insular and operculum infarct is now evident. No other infarcts are present. The basal ganglia are intact. There is enhancement of the meningioma. Vascular: Vascular calcifications are noted. Skull: Calvarium is intact. Sinuses: The paranasal sinuses and mastoid air cells are clear.  Orbits: Bilateral lens replacements are present. Globes and orbits are otherwise normal. Review of the MIP images confirms the above findings CTA NECK FINDINGS Aortic arch: A 3 vessel arch configuration is present. Atherosclerotic calcifications are present at the aorta. There is a small ulceration distal to the left subclavian artery. No aneurysm is present. Right carotid system: There is marked tortuosity of the right common carotid artery without a significant stenosis. Dense calcifications are present at the right carotid bifurcation. The minimal luminal diameter is 3 mm. This compares with a more normal distal vessel size of 5 mm. There is mild tortuosity of the cervical right ICA without significant stenosis. Left carotid system: The left common carotid artery is within normal limits. The bifurcation demonstrates calcified and noncalcified plaque. There is thrombus extending into the lumen. The left internal carotid artery is occluded 1.5 cm above the bifurcation without reconstitution in the neck. Vertebral arteries: Atherosclerotic calcifications are present at the origin of the left subclavian artery without significant stenosis. There is a dense calcification at the origin of the right vertebral artery without significant stenosis. Both vertebral arteries are tortuous proximally. There is no significant stenosis or occlusion of either vertebral artery in the neck. Skeleton: Grade 1 anterolisthesis present at C5-6, measuring 4 mm. Multilevel disc disease is present. No focal lytic or blastic lesions are present. Other neck: The thyroid gland is heterogeneous  without a dominant lesion. The gland is enlarged. No significant adenopathy is present. Salivary glands are within normal limits. Upper chest: Bilateral pleural effusions are present. There is dependent atelectasis. Micronodular disease in the lung apices bilaterally is concerning for infection. Review of the MIP images confirms the above findings CTA  HEAD FINDINGS Anterior circulation: Dense atherosclerotic calcifications are present throughout the cavernous right internal carotid artery without a significant stenosis. The left internal carotid artery is reconstituted at the level of the ophthalmic. The terminal right ICA is deviated medially by the meningioma. The vessel then stretches anteriorly around the meningioma. The M1 segment is intact. Left A1 and M1 segments are intact as well. The anterior communicating artery is patent. There is a high-grade stenosis or proximal occlusion of the anterior right M2 branch vessel. Posterior left M2 branch vessels demonstrate some regularity without focal stenosis or occlusion. There is distal branch vessel irregularity in the left MCA branch vessels is well. Posterior circulation: The vertebral arteries are codominant. PICA origins are visualized and normal. The basilar artery is tortuous somewhat small. Fetal type posterior cerebral arteries are present bilaterally. Small P1 segments are noted. The PCA branch vessels demonstrate distal segmental narrowing without a significant proximal stenosis or occlusion. Venous sinuses: The dural sinuses are patent. The straight sinus and deep cerebral veins are intact. Cortical veins are unremarkable. Anatomic variants: Fetal type posterior cerebral artery is bilaterally. Delayed phase: The postcontrast images demonstrate avid enhancement of the right sphenoid wing meningioma. The right insular cortex and operculum nonhemorrhagic infarct is well-defined on the postcontrast images. No other pathologic enhancement is present. Review of the MIP images confirms the above findings IMPRESSION: 1. Proximal anterior right M2 segment occlusion associated with the acute/subacute nonhemorrhagic infarct of the anterior right insular cortex and right frontal operculum. 2. 2.7 cm sphenoid wing meningioma displaces the right ICA terminus and M1 segment without occlusion. 3. Left internal  carotid artery occlusion just beyond the bifurcation with reconstitution at the ophthalmic segment. 4. Diffuse distal small vessel disease without MCA and PCA branch vessels bilaterally. 5. Dense atherosclerotic calcifications at the right carotid bifurcation without significant stenosis. 6. Dense atherosclerotic calcifications at the origin of the right vertebral artery proximally and within the right common carotid carotid artery without significant stenosis. 7.  Aortic Atherosclerosis (ICD10-I70.0). 8. Bilateral pleural effusions. 9. Micronodular airspace disease the lung apices, right greater than left. This is concerning for atypical infection. Electronically Signed   By: San Morelle M.D.   On: 09/19/2017 09:14   Dg Chest 2 View  Result Date: 09/26/2017 CLINICAL DATA:  Coughing congestion question pneumonia, history hypertension EXAM: CHEST  2 VIEW COMPARISON:  09/23/2017 FINDINGS: Enlargement of cardiac silhouette with mitral annular calcification. Atherosclerotic calcifications aorta. Pulmonary vascular congestion. Large hiatal hernia seen on the previous exam is inapparent on current study. BILATERAL pleural effusions LEFT greater than RIGHT increased on LEFT since prior exam. Increased atelectasis versus consolidation at LEFT lung base. Question minimal pulmonary edema, slightly improved on RIGHT. No pneumothorax. Bones demineralized with osteolysis of the RIGHT humeral head. IMPRESSION: Increased LEFT pleural effusion and basilar atelectasis versus consolidation. Enlargement of cardiac silhouette. Question mildly improved pulmonary edema with persistent small RIGHT pleural effusion. Electronically Signed   By: Lavonia Dana M.D.   On: 09/26/2017 16:08   Dg Chest 2 View  Result Date: 09/18/2017 CLINICAL DATA:  Facial weakness and speech changes EXAM: CHEST  2 VIEW COMPARISON:  03/13/2010 FINDINGS: Mild cardiomegaly. Small pleural effusions. Moderate hiatal hernia.  Infiltrate at the left lung  base. Vascular congestion with mild interstitial edema. Aortic atherosclerosis. No pneumothorax. Abnormal appearance of the right shoulder with possible anterior subluxation of the proximal right humerus, possible lytic process involving the right humeral head and glenoid fossa. IMPRESSION: 1. Cardiomegaly with vascular congestion and mild interstitial edema 2. Small pleural effusions.  Patchy infiltrate at the left lung base 3. Moderate hiatal hernia 4. Abnormal appearance of right shoulder with possible subluxation of proximal right humerus and possible lytic change of the right humeral head and glenoid fossa. Recommend dedicated right shoulder radiographs. Electronically Signed   By: Donavan Foil M.D.   On: 09/18/2017 19:26   Ct Head Wo Contrast  Result Date: 09/29/2017 CLINICAL DATA:  84 year old with a subacute right middle cerebral artery distribution stroke, initially diagnosed on 09/19/2017. Patient is currently unresponsive. Follow-up. EXAM: CT HEAD WITHOUT CONTRAST TECHNIQUE: Contiguous axial images were obtained from the base of the skull through the vertex without intravenous contrast. COMPARISON:  MRI brain 09/19/2017, 08/11/2014. CT head 09/19/2017, 09/18/2017, 08/11/2014 and earlier. CTA head 09/19/2017. FINDINGS: Brain: Very large right middle cerebral artery distribution stroke with edema throughout the right frontal lobe, temporal lobe and parietal lobe, resulting in effacement of cortical sulci, compression of the right lateral ventricle, and shift of the midline to the left of approximately 8 mm. No evidence of associated hemorrhage or hematoma. Uncal herniation may be present, though is difficult to state with certainty due to the presence of the previously identified partially calcified approximate 2.5 cm meningioma arising from the right sphenoid bone. Fourth ventricle is midline. No evidence of hydrocephalus. Mild changes of small vessel disease of the white matter throughout the left  cerebral hemisphere. No new parenchymal abnormalities involving the remainder of the brain. Vascular: Hyperdense right middle cerebral artery. Severe bilateral carotid siphon atherosclerosis. Skull: No skull fracture or other focal osseous abnormality involving the skull. Sinuses/Orbits: Mucosal thickening and air-fluid level involving the right sphenoid sinus, new since 09/19/2017. Remaining paranasal sinuses, bilateral mastoid air cells and bilateral middle ear cavities well-aerated. Hypoplastic frontal sinuses as noted previously. Visualized orbits and globes intact. Other: None. IMPRESSION: 1. Very large nonhemorrhagic right middle cerebral artery distribution stroke with midline shift to the left of approximately 8 mm. 2. Partially calcified approximate 2.5 cm right sphenoid meningioma as noted previously. 3. Acute right sphenoid sinusitis. Electronically Signed   By: Evangeline Dakin M.D.   On: 09/29/2017 14:12   Ct Head Wo Contrast  Result Date: 09/18/2017 CLINICAL DATA:  Facial droop and left-sided weakness with slurred speech EXAM: CT HEAD WITHOUT CONTRAST TECHNIQUE: Contiguous axial images were obtained from the base of the skull through the vertex without intravenous contrast. COMPARISON:  MRI 08/12/2014, CT brain 08/19/2017 FINDINGS: Brain: No acute territorial infarction, hemorrhage or new intracranial mass is visualized. Re- demonstrated partially calcified right parasellar mass measuring 2.4 x 2.2 cm, previously characterized as meningioma. Mild small vessel ischemic changes of the white matter. Mild atrophy. Stable ventricle size Vascular: No hyperdense vessels.  Carotid artery calcification. Skull: Normal. Negative for fracture or focal lesion. Sinuses/Orbits: Mild mucosal thickening in the sphenoid and ethmoid sinuses. No acute orbital abnormality Other: None IMPRESSION: 1. No CT evidence for acute intracranial abnormality. 2. Stable 2.4 cm partially calcified right parasellar mass consistent  with history of meningioma. 3. Atrophy and mild small vessel ischemic changes of the white matter Electronically Signed   By: Donavan Foil M.D.   On: 09/18/2017 19:23   Ct Angio Neck W  Or Wo Contrast  Result Date: 09/19/2017 CLINICAL DATA:  Right frontal lobe infarct.  TIA symptoms. EXAM: CT ANGIOGRAPHY HEAD AND NECK TECHNIQUE: Multidetector CT imaging of the head and neck was performed using the standard protocol during bolus administration of intravenous contrast. Multiplanar CT image reconstructions and MIPs were obtained to evaluate the vascular anatomy. Carotid stenosis measurements (when applicable) are obtained utilizing NASCET criteria, using the distal internal carotid diameter as the denominator. CONTRAST:  3mL ISOVUE-370 IOPAMIDOL (ISOVUE-370) INJECTION 76% COMPARISON:  MRI brain from the same day. CT head without contrast 09/18/2017 FINDINGS: CT HEAD FINDINGS Brain: The anterior right insular and operculum infarct is now evident. No other infarcts are present. The basal ganglia are intact. There is enhancement of the meningioma. Vascular: Vascular calcifications are noted. Skull: Calvarium is intact. Sinuses: The paranasal sinuses and mastoid air cells are clear. Orbits: Bilateral lens replacements are present. Globes and orbits are otherwise normal. Review of the MIP images confirms the above findings CTA NECK FINDINGS Aortic arch: A 3 vessel arch configuration is present. Atherosclerotic calcifications are present at the aorta. There is a small ulceration distal to the left subclavian artery. No aneurysm is present. Right carotid system: There is marked tortuosity of the right common carotid artery without a significant stenosis. Dense calcifications are present at the right carotid bifurcation. The minimal luminal diameter is 3 mm. This compares with a more normal distal vessel size of 5 mm. There is mild tortuosity of the cervical right ICA without significant stenosis. Left carotid system:  The left common carotid artery is within normal limits. The bifurcation demonstrates calcified and noncalcified plaque. There is thrombus extending into the lumen. The left internal carotid artery is occluded 1.5 cm above the bifurcation without reconstitution in the neck. Vertebral arteries: Atherosclerotic calcifications are present at the origin of the left subclavian artery without significant stenosis. There is a dense calcification at the origin of the right vertebral artery without significant stenosis. Both vertebral arteries are tortuous proximally. There is no significant stenosis or occlusion of either vertebral artery in the neck. Skeleton: Grade 1 anterolisthesis present at C5-6, measuring 4 mm. Multilevel disc disease is present. No focal lytic or blastic lesions are present. Other neck: The thyroid gland is heterogeneous without a dominant lesion. The gland is enlarged. No significant adenopathy is present. Salivary glands are within normal limits. Upper chest: Bilateral pleural effusions are present. There is dependent atelectasis. Micronodular disease in the lung apices bilaterally is concerning for infection. Review of the MIP images confirms the above findings CTA HEAD FINDINGS Anterior circulation: Dense atherosclerotic calcifications are present throughout the cavernous right internal carotid artery without a significant stenosis. The left internal carotid artery is reconstituted at the level of the ophthalmic. The terminal right ICA is deviated medially by the meningioma. The vessel then stretches anteriorly around the meningioma. The M1 segment is intact. Left A1 and M1 segments are intact as well. The anterior communicating artery is patent. There is a high-grade stenosis or proximal occlusion of the anterior right M2 branch vessel. Posterior left M2 branch vessels demonstrate some regularity without focal stenosis or occlusion. There is distal branch vessel irregularity in the left MCA  branch vessels is well. Posterior circulation: The vertebral arteries are codominant. PICA origins are visualized and normal. The basilar artery is tortuous somewhat small. Fetal type posterior cerebral arteries are present bilaterally. Small P1 segments are noted. The PCA branch vessels demonstrate distal segmental narrowing without a significant proximal stenosis or occlusion. Venous  sinuses: The dural sinuses are patent. The straight sinus and deep cerebral veins are intact. Cortical veins are unremarkable. Anatomic variants: Fetal type posterior cerebral artery is bilaterally. Delayed phase: The postcontrast images demonstrate avid enhancement of the right sphenoid wing meningioma. The right insular cortex and operculum nonhemorrhagic infarct is well-defined on the postcontrast images. No other pathologic enhancement is present. Review of the MIP images confirms the above findings IMPRESSION: 1. Proximal anterior right M2 segment occlusion associated with the acute/subacute nonhemorrhagic infarct of the anterior right insular cortex and right frontal operculum. 2. 2.7 cm sphenoid wing meningioma displaces the right ICA terminus and M1 segment without occlusion. 3. Left internal carotid artery occlusion just beyond the bifurcation with reconstitution at the ophthalmic segment. 4. Diffuse distal small vessel disease without MCA and PCA branch vessels bilaterally. 5. Dense atherosclerotic calcifications at the right carotid bifurcation without significant stenosis. 6. Dense atherosclerotic calcifications at the origin of the right vertebral artery proximally and within the right common carotid carotid artery without significant stenosis. 7.  Aortic Atherosclerosis (ICD10-I70.0). 8. Bilateral pleural effusions. 9. Micronodular airspace disease the lung apices, right greater than left. This is concerning for atypical infection. Electronically Signed   By: San Morelle M.D.   On: 09/19/2017 09:14   Mr Brain  Wo Contrast  Result Date: 09/19/2017 CLINICAL DATA:  Focal neuro deficits greater than 6 hours. New onset left upper and lower extremity weakness and facial droop. EXAM: MRI HEAD WITHOUT CONTRAST TECHNIQUE: Multiplanar, multiecho pulse sequences of the brain and surrounding structures were obtained without intravenous contrast. COMPARISON:  CTA head and neck from the same day. CT head without contrast 09/18/2017. MRI brain 08/12/2014 FINDINGS: Brain: Diffusion-weighted images demonstrate acute/ subacute nonhemorrhagic infarct involving the anterior right insular ribbon and right frontal operculum. The precentral gyrus is not involved. Left hemisphere demonstrates no acute abnormality. A right sphenoid wing meningioma demonstrates some interval growth, now measuring 2.3 x 2.3 x 1.7 cm. This compares with 2.2 x 2.2 x 1.6 cm on the previous study. Periventricular and subcortical T2 changes bilaterally are stable. Ventricles proportionate to the degree of atrophy. No significant extra-axial fluid collection is present. T2 changes are associated with the area of acute/subacute infarct. Vascular: The cavernous left internal carotid artery is occluded. Flow is present in the cavernous right internal carotid artery. Flow is present in the vertebral arteries and basilar artery. Skull and upper cervical spine: The skullbase is otherwise within normal limits. The craniocervical junction is normal. Sinuses/Orbits: Mild ethmoid mucosal thickening is present. The paranasal sinuses are otherwise clear. Asymmetric right sphenoid mucosal thickening is present. There is some fluid in the inferior mastoid air cells bilaterally. No obstructing nasopharyngeal lesion is present. Bilateral lens replacements are present. The globes and orbits are otherwise within normal. IMPRESSION: 1. Acute/subacute nonhemorrhagic infarct involving the anterior right insular ribbon and right frontal operculum. 2. Slight interval growth of right  sphenoid wing meningioma with local mass effect. 3. Chronic occlusion of the left internal carotid artery below the ophthalmic segment. 4. Stable atrophy and white matter disease bilaterally. Electronically Signed   By: San Morelle M.D.   On: 09/19/2017 10:03   Dg Chest Port 1 View  Result Date: 09/28/2017 CLINICAL DATA:  Short of breath.  Wheezing. EXAM: PORTABLE CHEST 1 VIEW COMPARISON:  09/26/2017 FINDINGS: Midline trachea. Cardiomegaly accentuated by AP portable technique. Atherosclerosis in the transverse aorta. Left larger than right pleural effusions are similar. No pneumothorax. Moderate interstitial edema is slightly increased. Left base airspace  disease is similar and right base airspace disease is worsened. IMPRESSION: Slightly worsened aeration, with increased congestive failure and right base airspace disease. Left base airspace disease and bilateral pleural effusions are not significantly changed. Aortic Atherosclerosis (ICD10-I70.0). Electronically Signed   By: Abigail Miyamoto M.D.   On: 09/28/2017 07:48   Dg Chest Port 1 View  Result Date: 09/23/2017 CLINICAL DATA:  Shortness of breath EXAM: PORTABLE CHEST 1 VIEW COMPARISON:  Chest x-ray dated 09/18/2017. FINDINGS: Increased perihilar and bibasilar opacities, likely representing an combination of pulmonary edema and bilateral pleural effusions. Large hiatal hernia, as demonstrated on earlier abdomen CT of 03/05/2016. Heart size and mediastinal contours are stable. Atherosclerotic changes noted at the aortic arch. Chronic deformity/dislocation of the right humeral head. No acute or suspicious osseous finding. IMPRESSION: 1. New perihilar and bibasilar opacities, most suggestive of a combination of pulmonary edema and bilateral pleural effusions. 2. Stable cardiomegaly. 3. Large hiatal hernia. 4. Aortic atherosclerosis. Electronically Signed   By: Franki Cabot M.D.   On: 09/23/2017 18:45   Dg Shoulder Right Port  Result Date:  09/19/2017 CLINICAL DATA:  Chronic right arm pain EXAM: PORTABLE RIGHT SHOULDER COMPARISON:  09/18/2017, 03/13/2010 FINDINGS: There is complete resorption of the proximal portion of the right humerus. Some calcific changes noted along the superolateral aspect of the joint capsule are noted. Increased lucency is noted likely related to large joint effusion. Some erosive changes of the glenoid are noted as well. The underlying bony thorax is within normal limits. IMPRESSION: Chronic appearing changes of the right shoulder joint with resorption of the humeral head and portions of the glenoid with known large joint effusion. This is likely related to a chronic inflammatory change. The need for further evaluation can be determined on a clinical basis. Electronically Signed   By: Inez Catalina M.D.   On: 09/19/2017 18:03   Dg Swallowing Func-speech Pathology  Result Date: 09/27/2017 Objective Swallowing Evaluation: Type of Study: MBS-Modified Barium Swallow Study  Patient Details Name: [name] MRN: [id number] Date of Birth: September 28, 1922 Today's Date: 09/27/2017 Time: SLP Start Time (ACUTE ONLY): 1055 -SLP Stop Time (ACUTE ONLY): 1120 SLP Time Calculation (min) (ACUTE ONLY): 25 min Past Medical History: Past Medical History: Diagnosis Date . Anemia  . Arthritis  . History of umbilical hernia repair  . Hypertension  Past Surgical History: Past Surgical History: Procedure Laterality Date . APPENDECTOMY   . ESOPHAGOGASTRODUODENOSCOPY N/A 09/23/2017  Procedure: ESOPHAGOGASTRODUODENOSCOPY (EGD);  Surgeon: Laurence Spates, MD;  Location: [facility] ENDOSCOPY;  Service: Endoscopy;  Laterality: N/A; . HERNIA REPAIR   . TOTAL KNEE ARTHROPLASTY    right . TOTAL KNEE ARTHROPLASTY    left . UMBILICAL HERNIA REPAIR   HPI: [name] is a 84 y.o. female with history of hypertension, possibly CHF, hypothyroidism and meningioma was brought to the ER after patient was found to have left-sided weakness. CT negative for acute changes, showed  meningioma. 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 Seen by SLP for cognitive evaluation only as she passed stroke swallow screen. Pt had coffee ground emesis 12/30. 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 Large hiatal hernia seen on CXR 09/23/17. Referred for swallowing evaluation after RN noted coughing with meal.  Subjective: Pt in chair Assessment / Plan / Recommendation CHL IP CLINICAL IMPRESSIONS 09/27/2017 Clinical Impression Patient presents with moderate oropharyngeal dysphagia with sensory and motor impairments  contributing to aspiration of thin and nectar thick liquids. Oral stage noted for prolonged oral transit, intermittent oral holding. Swallow initiation delayed to the level of the pyriform sinuses with thin, nectar and larger boluses of honey-thick liquids. This resulted in spillage from pyriforms with trace aspiration during the swallow with thin liquids. Reduced tongue base retraction and pharyngeal constriction resulting in residue in the valleculae, pyriform sinuses. Residue in pyriforms pooled with secretions, leading to trace aspiration after the swallow with thin, nectar and penetration with larger cup sips of honey. Teaspoon delivery confined residue to valleculae with smaller, thicker boluses (honey, puree, solids). While pt did sense aspiration and in some cases laryngeal penetration, her cough was either delayed or too weak to fully clear airway. Chin tuck was not effective for airway protection with thinner liquids, though with a dry swallow chin tuck reduced residue in the valleculae with teaspoons of puree, honey thick liquids. One instance of deep penetration of puree when taken with barium tablet due to oral discoordination with premature spillage. Pt ultimately retained barium tablet along her palate, requiring SLP  to retrieve it. Recommend pt consume dys 2 solids with honey thick liquids via teaspoon only, full supervision, meds crushed in puree. She remains at mild-moderate aspiration risk due to residue. Effortful swallow with chin tuck intermittently to clear residue. SLP will f/u for education and training in compensatory strategies. She may benefit from RMT for pharyngeal and cough strength. SLP Visit Diagnosis Dysphagia, oropharyngeal phase (R13.12) Attention and concentration deficit following -- Frontal lobe and executive function deficit following -- Impact on safety and function Mild aspiration risk;Moderate aspiration risk   CHL IP TREATMENT RECOMMENDATION 09/27/2017 Treatment Recommendations F/U MBS in --- days (Comment);Therapy as outlined in treatment plan below   Prognosis 09/27/2017 Prognosis for Safe Diet Advancement Good Barriers to Reach Goals -- Barriers/Prognosis Comment -- CHL IP DIET RECOMMENDATION 09/27/2017 SLP Diet Recommendations Dysphagia 2 (Fine chop) solids;Honey thick liquids Liquid Administration via Spoon;No straw Medication Administration Crushed with puree Compensations Effortful swallow;Chin tuck;Multiple dry swallows after each bite/sip Postural Changes --   CHL IP OTHER RECOMMENDATIONS 09/27/2017 Recommended Consults -- Oral Care Recommendations Oral care BID Other Recommendations --   CHL IP FOLLOW UP RECOMMENDATIONS 09/27/2017 Follow up Recommendations Skilled Nursing facility   [facility] IP FREQUENCY AND DURATION 09/27/2017 Speech Therapy Frequency (ACUTE ONLY) min 2x/week Treatment Duration 2 weeks      CHL IP ORAL PHASE 09/27/2017 Oral Phase Impaired Oral - Pudding Teaspoon -- Oral - Pudding Cup -- Oral - Honey Teaspoon -- Oral - Honey Cup -- Oral - Nectar Teaspoon -- Oral - Nectar Cup -- Oral - Nectar Straw -- Oral - Thin Teaspoon -- Oral - Thin Cup -- Oral - Thin Straw -- Oral - Puree -- Oral - Mech Soft -- Oral - Regular -- Oral - Multi-Consistency -- Oral - Pill -- Oral Phase - Comment intermittent  oral holding, prolonged oral transit, decreased bolus cohesion/premature spillage with mixed consistencies  CHL IP PHARYNGEAL PHASE 09/27/2017 Pharyngeal Phase Impaired Pharyngeal- Pudding Teaspoon -- Pharyngeal -- Pharyngeal- Pudding Cup -- Pharyngeal -- Pharyngeal- Honey Teaspoon Delayed swallow initiation-vallecula;Reduced tongue base retraction;Reduced pharyngeal peristalsis;Pharyngeal residue - valleculae;Compensatory strategies attempted (with notebox) Pharyngeal -- Pharyngeal- Honey Cup Delayed swallow initiation-pyriform sinuses;Reduced pharyngeal peristalsis;Reduced tongue base retraction;Penetration/Apiration after swallow;Pharyngeal residue - valleculae;Pharyngeal residue - pyriform Pharyngeal Material enters airway, remains ABOVE vocal cords and not ejected out Pharyngeal- Nectar Teaspoon Delayed swallow initiation-pyriform sinuses;Reduced pharyngeal peristalsis;Reduced tongue base retraction;Penetration/Apiration after swallow;Pharyngeal residue - valleculae;Pharyngeal residue -  pyriform Pharyngeal Material enters airway, remains ABOVE vocal cords and not ejected out Pharyngeal- Nectar Cup Delayed swallow initiation-pyriform sinuses;Reduced pharyngeal peristalsis;Reduced tongue base retraction;Penetration/Aspiration during swallow;Trace aspiration;Pharyngeal residue - valleculae;Pharyngeal residue - pyriform Pharyngeal Material enters airway, passes BELOW cords and not ejected out despite cough attempt by patient Pharyngeal- Nectar Straw Delayed swallow initiation-pyriform sinuses;Reduced tongue base retraction;Penetration/Aspiration during swallow;Trace aspiration;Pharyngeal residue - pyriform;Pharyngeal residue - valleculae Pharyngeal Material enters airway, passes BELOW cords and not ejected out despite cough attempt by patient Pharyngeal- Thin Teaspoon Delayed swallow initiation-pyriform sinuses;Reduced pharyngeal peristalsis;Reduced tongue base retraction;Penetration/Aspiration during swallow;Trace  aspiration;Pharyngeal residue - valleculae;Pharyngeal residue - pyriform Pharyngeal Material enters airway, passes BELOW cords and not ejected out despite cough attempt by patient Pharyngeal- Thin Cup Delayed swallow initiation-pyriform sinuses;Reduced pharyngeal peristalsis;Reduced tongue base retraction;Penetration/Aspiration during swallow;Trace aspiration;Pharyngeal residue - valleculae;Pharyngeal residue - pyriform Pharyngeal Material enters airway, passes BELOW cords and not ejected out despite cough attempt by patient Pharyngeal- Thin Straw -- Pharyngeal -- Pharyngeal- Puree Delayed swallow initiation-vallecula;Reduced pharyngeal peristalsis;Reduced tongue base retraction;Pharyngeal residue - valleculae;Other (Comment) Pharyngeal -- Pharyngeal- Mechanical Soft -- Pharyngeal -- Pharyngeal- Regular Delayed swallow initiation-vallecula;Reduced pharyngeal peristalsis;Reduced tongue base retraction;Pharyngeal residue - valleculae Pharyngeal -- Pharyngeal- Multi-consistency -- Pharyngeal -- Pharyngeal- Pill NT Pharyngeal -- Pharyngeal Comment --  CHL IP CERVICAL ESOPHAGEAL PHASE 09/27/2017 Cervical Esophageal Phase WFL Pudding Teaspoon -- Pudding Cup -- Honey Teaspoon -- Honey Cup -- Nectar Teaspoon -- Nectar Cup -- Nectar Straw -- Thin Teaspoon -- Thin Cup -- Thin Straw -- Puree -- Mechanical Soft -- Regular -- Multi-consistency -- Pill -- Cervical Esophageal Comment -- CHL IP GO 09/20/2017 Functional Assessment Tool Used skilled clinical judgment Functional Limitations Memory Swallow Current Status (U2353) (None) Swallow Goal Status (I1443) (None) Swallow Discharge Status (X5400) (None) Motor Speech Current Status (Q6761) (None) Motor Speech Goal Status (P5093) (None) Motor Speech Goal Status (O6712) (None) Spoken Language Comprehension Current Status (W5809) (None) Spoken Language Comprehension Goal Status (X8338) (None) Spoken Language Comprehension Discharge Status [id number]) (None) Spoken Language Expression  Current Status (Z7673) (None) Spoken Language Expression Goal Status (A1937) (None) Spoken Language Expression Discharge Status [phone]) (None) Attention Current Status (X7353) (None) Attention Goal Status (G9924) (None) Attention Discharge Status (Q6834) (None) Memory Current Status (H9622) CJ Memory Goal Status (W9798) [place] Memory Discharge Status (X2119) CJ Voice Current Status (E1740) (None) Voice Goal Status (C1448) (None) Voice Discharge Status (J8563) (None) Other Speech-Language Pathology Functional Limitation Current Status (J4970) (None) Other Speech-Language Pathology Functional Limitation Goal Status (Y6378) (None) Other Speech-Language Pathology Functional Limitation Discharge Status [phone]) (None) Deneise Lever, MS, CCC-SLP Speech-Language Pathologist [phone] Aliene Altes 09/27/2017, 12:19 PM                 Subjective: Unresponsive to any stimuli. As per RN, no acute issues.  Discharge Exam:  Vitals:   09/29/17 2128 09/30/17 1902 10/01/17 0530 10/01/17 1041  BP: (!) 167/76 (!) 186/67 (!) 185/80 (!) 154/54  Pulse: 81 81 94 72  Resp: 18 18 18 18   Temp: 98.6 F (37 C)  98.2 F (36.8 C) 98.2 F (36.8 C)  TempSrc: Oral Axillary Axillary Axillary  SpO2: 97% 98% 98% 99%  Weight:      Height:        General exam: Elderly female, lying comfortably in bed. Appears quite comfortable and in no obvious distress. Respiratory system: No gurgly respiration today that was noted yesterday. Diminished sounds bilaterally. No increased work of breathing. Cardiovascular system: S1 & S2 heard, RRR. No JVD, murmurs, rubs, gallops  or clicks. No pedal edema. Gastrointestinal system: Abdomen is nondistended, soft and nontender. No organomegaly or masses felt. Normal bowel sounds heard. Central nervous system: Unresponsive.      The results of significant diagnostics from this hospitalization (including imaging, microbiology, ancillary and laboratory) are listed below for reference.        Labs: CBC: Recent Labs  Lab 09/27/17 0312  WBC 11.0*  NEUTROABS 8.3*  HGB 8.4*  HCT 27.1*  MCV 98.2  PLT 498   Basic Metabolic Panel: Recent Labs  Lab 09/25/17 0339 09/26/17 0346 09/27/17 0312  NA 137 137 138  K 3.6 3.6 4.2  CL 105 103 108  CO2 25 26 23   GLUCOSE 124* 108* 100*  BUN 24* 17 12  CREATININE 0.93 0.85 0.86  CALCIUM 8.9 8.7* 8.6*   Urinalysis    Component Value Date/Time   COLORURINE YELLOW 09/23/2017 2008   APPEARANCEUR HAZY (A) 09/23/2017 2008   LABSPEC 1.010 09/23/2017 2008   PHURINE 6.0 09/23/2017 2008   GLUCOSEU NEGATIVE 09/23/2017 2008   HGBUR SMALL (A) 09/23/2017 2008   BILIRUBINUR NEGATIVE 09/23/2017 2008   KETONESUR NEGATIVE 09/23/2017 2008   PROTEINUR NEGATIVE 09/23/2017 2008   UROBILINOGEN 0.2 08/30/2010 1430   NITRITE NEGATIVE 09/23/2017 2008   LEUKOCYTESUR SMALL (A) 09/23/2017 2008      Time coordinating discharge: Less than 30 minutes  SIGNED:  Vernell Leep, MD, FACP, [facility]. [facility] Pager [phone]  If 7PM-7AM, please contact night-coverage [email or web address] Password [email or web address] 10/01/2017, 11:26 AM

## 2017-10-23 DEATH — deceased

## 2019-11-08 IMAGING — DX DG CHEST 1V PORT
1 series · 1 of 1 positions shown · non-contrast
Comparison: 09/26/2017

CLINICAL DATA: Short of breath.  Wheezing.

EXAM:
PORTABLE CHEST 1 VIEW

[chest]
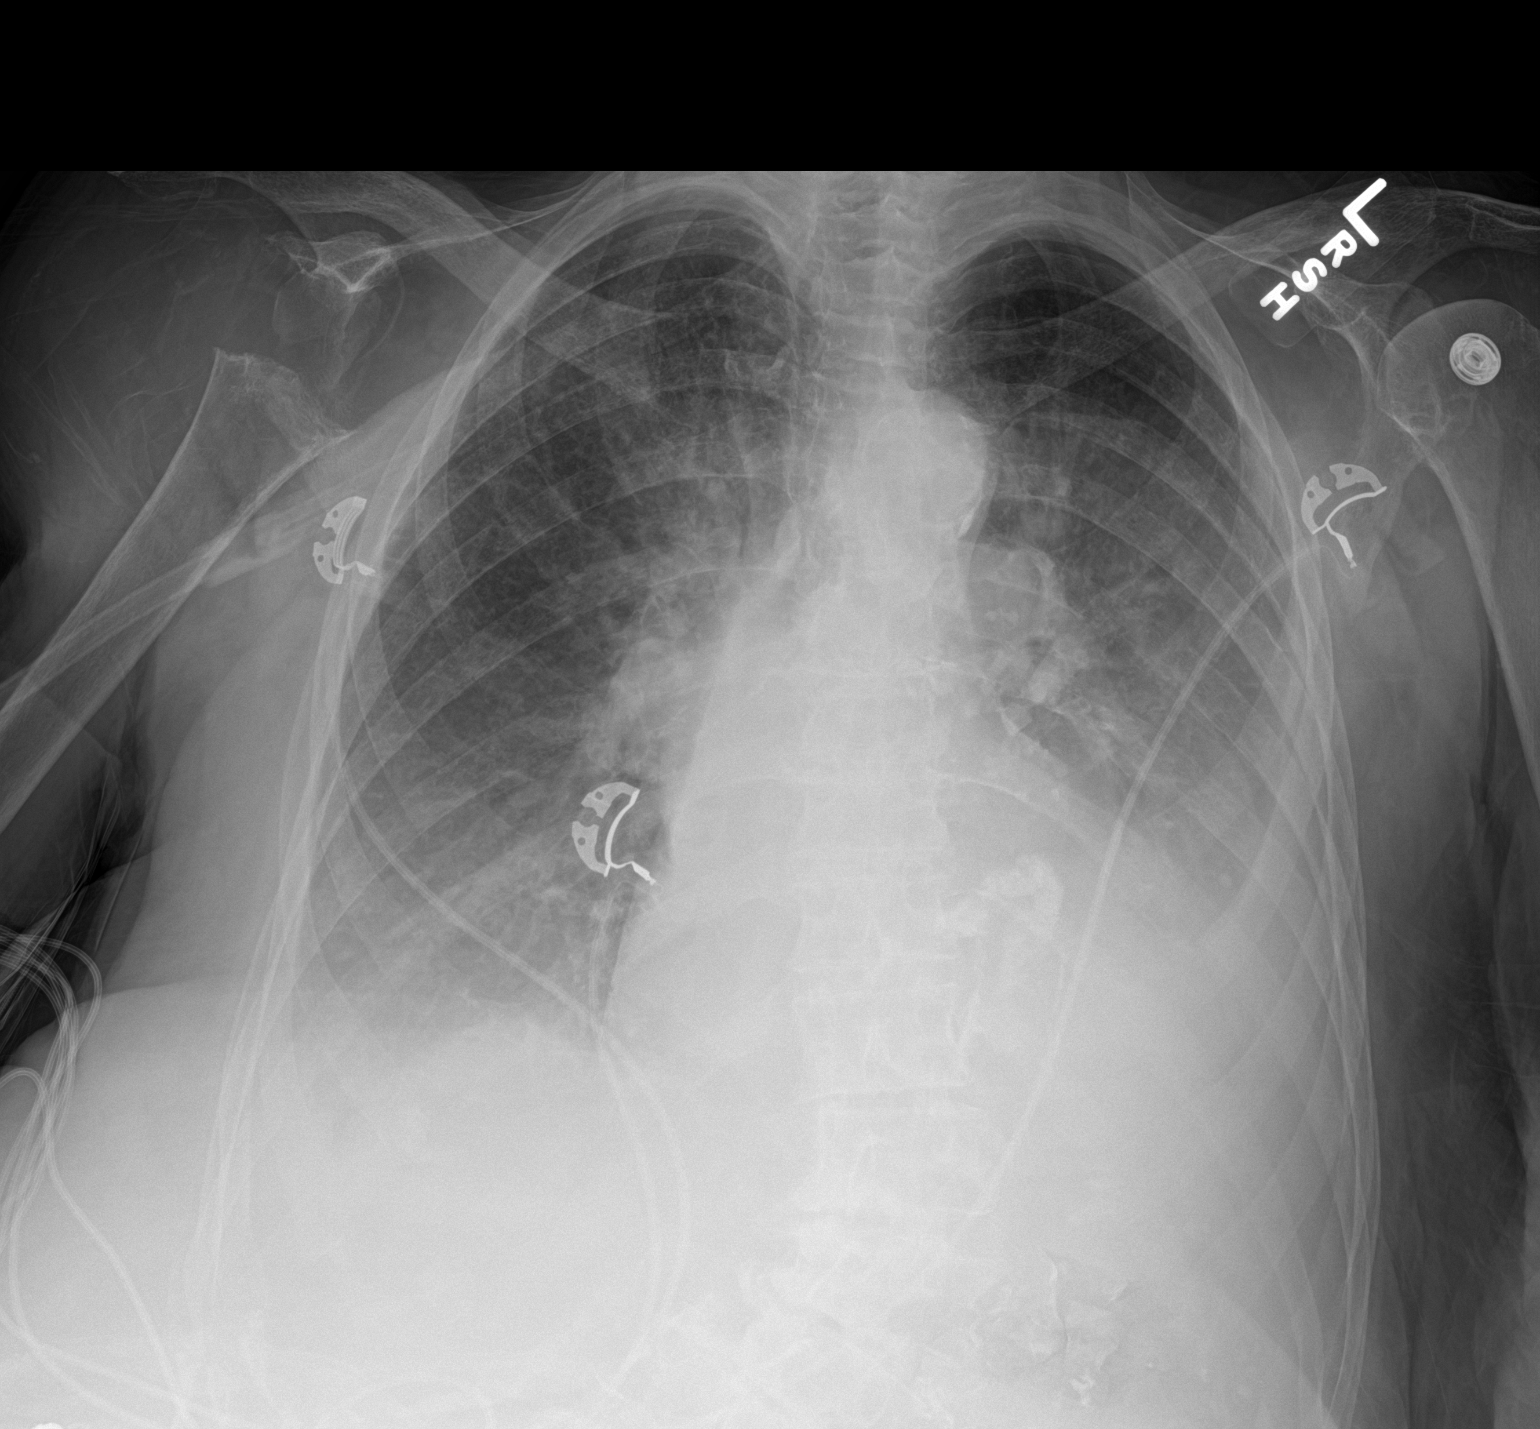

[1 of 1 positions shown; findings below may reference images not displayed]

FINDINGS: Midline trachea. Cardiomegaly accentuated by AP portable technique.
Atherosclerosis in the transverse aorta. Left larger than right
pleural effusions are similar. No pneumothorax. Moderate
interstitial edema is slightly increased. Left base airspace disease
is similar and right base airspace disease is worsened.
IMPRESSION: Slightly worsened aeration, with increased congestive failure and
right base airspace disease.

Left base airspace disease and bilateral pleural effusions are not
significantly changed.

Aortic Atherosclerosis ([id number]).
